# Patient Record
Sex: Male | Born: 1995
Health system: Southern US, Community
[De-identification: ages and names within clinical notes are randomized; demographics above are authoritative.]

## PROBLEM LIST (undated history)

## (undated) DIAGNOSIS — R011 Cardiac murmur, unspecified: Secondary | ICD-10-CM

## (undated) DIAGNOSIS — E162 Hypoglycemia, unspecified: Secondary | ICD-10-CM

## (undated) DIAGNOSIS — R079 Chest pain, unspecified: Secondary | ICD-10-CM

## (undated) DIAGNOSIS — M549 Dorsalgia, unspecified: Secondary | ICD-10-CM

## (undated) DIAGNOSIS — J45909 Unspecified asthma, uncomplicated: Secondary | ICD-10-CM

---
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
## 2013-03-10 ENCOUNTER — Emergency Department (HOSPITAL_COMMUNITY)
Admission: EM | Admit: 2013-03-10 | Discharge: 2013-03-10 | Disposition: A | Discharge status: Home / Self Care | Payer: No Typology Code available for payment source | Attending: Emergency Medicine | Admitting: Emergency Medicine

## 2013-03-10 ENCOUNTER — Emergency Department (HOSPITAL_COMMUNITY): Payer: No Typology Code available for payment source

## 2013-03-10 ENCOUNTER — Encounter (HOSPITAL_COMMUNITY): Payer: Self-pay | Admitting: Emergency Medicine

## 2013-03-10 DIAGNOSIS — Z87828 Personal history of other (healed) physical injury and trauma: Secondary | ICD-10-CM | POA: Insufficient documentation

## 2013-03-10 DIAGNOSIS — M549 Dorsalgia, unspecified: Secondary | ICD-10-CM | POA: Insufficient documentation

## 2013-03-10 DIAGNOSIS — Z8639 Personal history of other endocrine, nutritional and metabolic disease: Secondary | ICD-10-CM | POA: Insufficient documentation

## 2013-03-10 DIAGNOSIS — Z862 Personal history of diseases of the blood and blood-forming organs and certain disorders involving the immune mechanism: Secondary | ICD-10-CM | POA: Insufficient documentation

## 2013-03-10 DIAGNOSIS — J45909 Unspecified asthma, uncomplicated: Secondary | ICD-10-CM | POA: Insufficient documentation

## 2013-03-10 DIAGNOSIS — R079 Chest pain, unspecified: Secondary | ICD-10-CM | POA: Insufficient documentation

## 2013-03-10 DIAGNOSIS — R011 Cardiac murmur, unspecified: Secondary | ICD-10-CM | POA: Insufficient documentation

## 2013-03-10 LAB — CBC
HEMATOCRIT: 42.4 % (ref 39.0–52.0)
Hemoglobin: 14.9 g/dL (ref 13.0–17.0)
MCH: 29.6 pg (ref 26.0–34.0)
MCHC: 35.1 g/dL (ref 30.0–36.0)
MCV: 84.1 fL (ref 78.0–100.0)
Platelets: 207 10*3/uL (ref 150–400)
RBC: 5.04 MIL/uL (ref 4.22–5.81)
RDW: 13.2 % (ref 11.5–15.5)
WBC: 3.4 10*3/uL — AB (ref 4.0–10.5)

## 2013-03-10 LAB — BASIC METABOLIC PANEL
BUN: 11 mg/dL (ref 6–23)
CHLORIDE: 104 meq/L (ref 96–112)
CO2: 25 meq/L (ref 19–32)
Calcium: 9.7 mg/dL (ref 8.4–10.5)
Creatinine, Ser: 0.97 mg/dL (ref 0.50–1.35)
GFR calc Af Amer: >90 mL/min (ref >90)
GFR calc non Af Amer: >90 mL/min (ref >90)
Glucose, Bld: 89 mg/dL (ref 70–99)
Potassium: 4.3 mEq/L (ref 3.7–5.3)
Sodium: 140 mEq/L (ref 137–147)

## 2013-03-10 LAB — I-STAT TROPONIN, ED: Troponin i, poc: 0 ng/mL (ref 0.00–0.08)

## 2013-03-10 LAB — D-DIMER, QUANTITATIVE (NOT AT ARMC): D-Dimer, Quant: <0.27 ug/mL-FEU (ref 0.00–0.48)

## 2013-03-10 MED ORDER — KETOROLAC TROMETHAMINE 60 MG/2ML IM SOLN
60.0000 mg | Freq: Once | INTRAMUSCULAR | Status: AC
  Administered 2013-03-10: 60 mg via INTRAMUSCULAR
  Filled 2013-03-10 (×2): qty 2

## 2013-03-10 MED ORDER — IBUPROFEN 800 MG PO TABS: 800.0000 mg | ORAL_TABLET | Freq: Three times a day (TID) | ORAL | Status: DC
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
## 2013-03-10 NOTE — ED Provider Notes (Signed)
Medical screening examination/treatment/procedure(s) were performed by non-physician practitioner and as supervising physician I was immediately available for consultation/collaboration.   EKG Interpretation   Date/Time:  Monday March 10 2013 13:23:54 EDT Ventricular Rate:  53 PR Interval:  162 QRS Duration: 90 QT Interval:  395 QTC Calculation: 371 R Axis:   87 Text Interpretation:  Sinus or ectopic atrial rhythm Abnrm T, consider  ischemia, anterolateral lds ST elev, probable normal early repol pattern  Anterolateral ST elevation, probable early repolarization No significant  change was found Confirmed by Manus GunningANCOUR  MD, Andria Head [phone]([id number]) on 03/10/2013  1:35:00 PM        Glynn OctaveStephen Timmie Dugue, MD 03/10/13 1555

## 2013-03-10 NOTE — Discharge Instructions (Signed)
Chest Pain (Nonspecific) °It is often hard to give a specific diagnosis for the cause of chest pain. There is always a chance that your pain could be related to something serious, such as a heart attack or a blood clot in the lungs. You need to follow up with your caregiver for further evaluation. °CAUSES  °· Heartburn. °· Pneumonia or bronchitis. °· Anxiety or stress. °· Inflammation around your heart (pericarditis) or lung (pleuritis or pleurisy). °· A blood clot in the lung. °· A collapsed lung (pneumothorax). It can develop suddenly on its own (spontaneous pneumothorax) or from injury (trauma) to the chest. °· Shingles infection (herpes zoster virus). °The chest wall is composed of bones, muscles, and cartilage. Any of these can be the source of the pain. °· The bones can be bruised by injury. °· The muscles or cartilage can be strained by coughing or overwork. °· The cartilage can be affected by inflammation and become sore (costochondritis). °DIAGNOSIS  °Lab tests or other studies, such as X-rays, electrocardiography, stress testing, or cardiac imaging, may be needed to find the cause of your pain.  °TREATMENT  °· Treatment depends on what may be causing your chest pain. Treatment may include: °· Acid blockers for heartburn. °· Anti-inflammatory medicine. °· Pain medicine for inflammatory conditions. °· Antibiotics if an infection is present. °· You may be advised to change lifestyle habits. This includes stopping smoking and avoiding alcohol, caffeine, and chocolate. °· You may be advised to keep your head raised (elevated) when sleeping. This reduces the chance of acid going backward from your stomach into your esophagus. °· Most of the time, nonspecific chest pain will improve within 2 to 3 days with rest and mild pain medicine. °HOME CARE INSTRUCTIONS  °· If antibiotics were prescribed, take your antibiotics as directed. Finish them even if you start to feel better. °· For the next few days, avoid physical  activities that bring on chest pain. Continue physical activities as directed. °· Do not smoke. °· Avoid drinking alcohol. °· Only take over-the-counter or prescription medicine for pain, discomfort, or fever as directed by your caregiver. °· Follow your caregiver's suggestions for further testing if your chest pain does not go away. °· Keep any follow-up appointments you made. If you do not go to an appointment, you could develop lasting (chronic) problems with pain. If there is any problem keeping an appointment, you must call to reschedule. °SEEK MEDICAL CARE IF:  °· You think you are having problems from the medicine you are taking. Read your medicine instructions carefully. °· Your chest pain does not go away, even after treatment. °· You develop a rash with blisters on your chest. °SEEK IMMEDIATE MEDICAL CARE IF:  °· You have increased chest pain or pain that spreads to your arm, neck, jaw, back, or abdomen. °· You develop shortness of breath, an increasing cough, or you are coughing up blood. °· You have severe back or abdominal pain, feel nauseous, or vomit. °· You develop severe weakness, fainting, or chills. °· You have a fever. °THIS IS AN EMERGENCY. Do not wait to see if the pain will go away. Get medical help at once. Call your local emergency services (911 in [place] Do not drive yourself to the hospital. °MAKE SURE YOU:  °· Understand these instructions. °· Will watch your condition. °· Will get help right away if you are not doing well or get worse. °Document Released: 09/28/2004 Document Revised: 03/13/2011 Document Reviewed: 07/25/2007 °[employer]® Patient Information ©2014 [employer]   [employer]. ° °

## 2013-03-10 NOTE — ED Notes (Signed)
Patient taken to Xray  

## 2013-03-10 NOTE — ED Notes (Signed)
PA Browning at the bedside.
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
## 2013-03-10 NOTE — ED Notes (Signed)
Patient requested different medication for pain. PA notified. Patient notified that the PA prefers to see the patient first. Verbalized understanding.

## 2013-03-21 ENCOUNTER — Emergency Department (HOSPITAL_COMMUNITY)
Admission: EM | Admit: 2013-03-21 | Discharge: 2013-03-21 | Disposition: A | Discharge status: Home / Self Care | Payer: 59 | Attending: Emergency Medicine | Admitting: Emergency Medicine

## 2013-03-21 ENCOUNTER — Encounter (HOSPITAL_COMMUNITY): Payer: Self-pay | Admitting: Emergency Medicine

## 2013-03-21 DIAGNOSIS — R42 Dizziness and giddiness: Secondary | ICD-10-CM | POA: Insufficient documentation

## 2013-03-21 DIAGNOSIS — R079 Chest pain, unspecified: Secondary | ICD-10-CM | POA: Insufficient documentation

## 2013-03-21 DIAGNOSIS — Z862 Personal history of diseases of the blood and blood-forming organs and certain disorders involving the immune mechanism: Secondary | ICD-10-CM | POA: Insufficient documentation

## 2013-03-21 DIAGNOSIS — R011 Cardiac murmur, unspecified: Secondary | ICD-10-CM | POA: Insufficient documentation

## 2013-03-21 DIAGNOSIS — M549 Dorsalgia, unspecified: Secondary | ICD-10-CM | POA: Insufficient documentation

## 2013-03-21 DIAGNOSIS — Z87448 Personal history of other diseases of urinary system: Secondary | ICD-10-CM | POA: Insufficient documentation

## 2013-03-21 DIAGNOSIS — J45909 Unspecified asthma, uncomplicated: Secondary | ICD-10-CM | POA: Insufficient documentation

## 2013-03-21 DIAGNOSIS — Z8639 Personal history of other endocrine, nutritional and metabolic disease: Secondary | ICD-10-CM | POA: Insufficient documentation

## 2013-03-21 LAB — CBC WITH DIFFERENTIAL/PLATELET
BASOS ABS: 0 10*3/uL (ref 0.0–0.1)
Basophils Relative: 1 % (ref 0–1)
Eosinophils Absolute: 0.1 10*3/uL (ref 0.0–0.7)
Eosinophils Relative: 2 % (ref 0–5)
HEMATOCRIT: 44.4 % (ref 39.0–52.0)
Hemoglobin: 15.4 g/dL (ref 13.0–17.0)
LYMPHS PCT: 45 % (ref 12–46)
Lymphs Abs: 1.4 10*3/uL (ref 0.7–4.0)
MCH: 29.2 pg (ref 26.0–34.0)
MCHC: 34.7 g/dL (ref 30.0–36.0)
MCV: 84.3 fL (ref 78.0–100.0)
MONO ABS: 0.2 10*3/uL (ref 0.1–1.0)
Monocytes Relative: 7 % (ref 3–12)
NEUTROS ABS: 1.5 10*3/uL — AB (ref 1.7–7.7)
Neutrophils Relative %: 45 % (ref 43–77)
PLATELETS: 204 10*3/uL (ref 150–400)
RBC: 5.27 MIL/uL (ref 4.22–5.81)
RDW: 13.5 % (ref 11.5–15.5)
WBC: 3.2 10*3/uL — AB (ref 4.0–10.5)

## 2013-03-21 LAB — BASIC METABOLIC PANEL
BUN: 17 mg/dL (ref 6–23)
CHLORIDE: 100 meq/L (ref 96–112)
CO2: 25 meq/L (ref 19–32)
Calcium: 10 mg/dL (ref 8.4–10.5)
Creatinine, Ser: 1.17 mg/dL (ref 0.50–1.35)
GFR calc Af Amer: >90 mL/min (ref >90)
GFR calc non Af Amer: 89 mL/min — ABNORMAL LOW (ref >90)
Glucose, Bld: 87 mg/dL (ref 70–99)
Potassium: 4.1 mEq/L (ref 3.7–5.3)
SODIUM: 138 meq/L (ref 137–147)

## 2013-03-21 LAB — I-STAT TROPONIN, ED: TROPONIN I, POC: 0 ng/mL (ref 0.00–0.08)

## 2013-03-21 LAB — D-DIMER, QUANTITATIVE: D-Dimer, Quant: <0.27 ug/mL-FEU (ref 0.00–0.48)

## 2013-03-21 MED ORDER — IBUPROFEN 800 MG PO TABS: 800.0000 mg | ORAL_TABLET | Freq: Three times a day (TID) | ORAL | Status: DC

## 2013-03-21 MED ORDER — HYDROCODONE-ACETAMINOPHEN 5-325 MG PO TABS
1.0000 | ORAL_TABLET | Freq: Once | ORAL | Status: AC
  Administered 2013-03-21: 1 via ORAL
  Filled 2013-03-21: qty 1

## 2013-03-21 NOTE — ED Notes (Addendum)
Pt c/o central and L side chest pain radiating into back x "over three months."  Pain score 9/10.  Pt reports "some" dizziness.  [facility] for same and diagnosed w/ nonspecific chest pain.

## 2013-03-21 NOTE — ED Provider Notes (Signed)
CSN: [id number]     Arrival date & time 03/21/13  1050 History   First MD Initiated Contact with Patient 03/21/13 1131     Chief Complaint  Patient presents with  . Chest Pain  . Back Pain    HPI Pt has history of gynecomastia.  He was having pain in his chest associated with that.    Pt had surgery one year ago approximately and symptoms were fine for several months.  He started having pain again several months later.  The pain is sharp and aching on the left side.  It occurs every day.   Working too hard makes it hurt more.  Today he felt dizzy.   No fevers, no cough, no shortness of breath.  No tobacco use.  He does not have a primary care doctor.  He was evaluated in the ED previously. Past Medical History  Diagnosis Date  . Hypoglycemia   . Asthma     seasonal- no current treatment  . 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 Heart murmur   . Back pain last 1-2 years    lower back-evaluation by PCP and physical therapy @ [facility]  . Chest pain march 2014    pain and lump at rt breast area   Past Surgical History  Procedure Laterality Date  . Gynecomastia mastectomy Right 04/12/2012    Procedure: MASTECTOMY GYNECOMASTIA;  Surgeon: Liz MaladyBurke E Thompson, MD;  Location: [facility];  Service: General;  Laterality: Right;  Excision of right gynecomastia   History reviewed. No pertinent family history. History  Substance Use Topics  . Smoking status: Never Smoker   . Smokeless tobacco: Never Used  . Alcohol Use: No    Review of Systems    Allergies  Review of patient's allergies indicates no known allergies.  Home Medications   Current Outpatient Rx  Name  Route  Sig  Dispense  Refill  . ibuprofen (ADVIL,MOTRIN) 800 MG tablet   Oral   Take 1 tablet (800 mg total) by mouth 3 (three) times daily.   21 tablet   0    BP 126/88  Pulse 60  Temp(Src) 97.9 F (36.6 C) (Oral)  Resp 16  SpO2 100% Physical Exam  Nursing note and  vitals reviewed. Constitutional: He appears well-developed and well-nourished. No distress.  HENT:  Head: Normocephalic and atraumatic.  Right Ear: External ear normal.  Left Ear: External ear normal.  Eyes: Conjunctivae are normal. Right eye exhibits no discharge. Left eye exhibits no discharge. No scleral icterus.  Neck: Neck supple. No tracheal deviation present.  Cardiovascular: Normal rate, regular rhythm and intact distal pulses.   Pulmonary/Chest: Effort normal and breath sounds normal. No stridor. No respiratory distress. He has no wheezes. He has no rales.  Abdominal: Soft. Bowel sounds are normal. He exhibits no distension. There is no tenderness. There is no rebound and no guarding.  Musculoskeletal: He exhibits no edema and no tenderness.  Neurological: He is alert. He has normal strength. No cranial nerve deficit (no facial droop, extraocular movements intact, no slurred speech) or sensory deficit. He exhibits normal muscle tone. He displays no seizure activity. Coordination normal.  Skin: Skin is warm and dry. No rash noted.  Psychiatric: He has a normal mood and affect.    ED Course  Procedures (including critical care time) Labs Review Labs Reviewed  CBC WITH DIFFERENTIAL - Abnormal; Notable for the following:    WBC 3.2 (*)  Neutro Abs 1.5 (*)    All other components within normal limits  BASIC METABOLIC PANEL - Abnormal; Notable for the following:    GFR calc non Af Amer 89 (*)    All other components within normal limits  D-DIMER, QUANTITATIVE  I-STAT TROPOININ, ED   CXR performed this month.  No acute findings noted.  EKG Interpretation   Date/Time:  Friday March 21 2013 12:03:48 EDT Ventricular Rate:  60 PR Interval:  159 QRS Duration: 88 QT Interval:  397 QTC Calculation: 397 R Axis:   82 Text Interpretation:  Sinus rhythm Left ventricular hypertrophy ST elev,  probable normal early repol pattern No significant change since last  tracing Confirmed  by Jamayah Myszka  MD-J, Evadene Wardrip ([id number]) on 03/21/2013 12:14:12 PM      MDM   Final diagnoses:  Chest pain    At this time there does not appear to be any evidence of an acute emergency medical condition and the patient appears stable for discharge with appropriate outpatient follow up.  Discussed findings with patient. Reassured him today.  Recommend follow up with  A PCP for further evaluation.    Celene Kras, MD 03/21/13 [phone]

## 2013-03-21 NOTE — Discharge Instructions (Signed)
Chest Pain (Nonspecific) °It is often hard to give a specific diagnosis for the cause of chest pain. There is always a chance that your pain could be related to something serious, such as a heart attack or a blood clot in the lungs. You need to follow up with your caregiver for further evaluation. °CAUSES  °· Heartburn. °· Pneumonia or bronchitis. °· Anxiety or stress. °· Inflammation around your heart (pericarditis) or lung (pleuritis or pleurisy). °· A blood clot in the lung. °· A collapsed lung (pneumothorax). It can develop suddenly on its own (spontaneous pneumothorax) or from injury (trauma) to the chest. °· Shingles infection (herpes zoster virus). °The chest wall is composed of bones, muscles, and cartilage. Any of these can be the source of the pain. °· The bones can be bruised by injury. °· The muscles or cartilage can be strained by coughing or overwork. °· The cartilage can be affected by inflammation and become sore (costochondritis). °DIAGNOSIS  °Lab tests or other studies, such as X-rays, electrocardiography, stress testing, or cardiac imaging, may be needed to find the cause of your pain.  °TREATMENT  °· Treatment depends on what may be causing your chest pain. Treatment may include: °· Acid blockers for heartburn. °· Anti-inflammatory medicine. °· Pain medicine for inflammatory conditions. °· Antibiotics if an infection is present. °· You may be advised to change lifestyle habits. This includes stopping smoking and avoiding alcohol, caffeine, and chocolate. °· You may be advised to keep your head raised (elevated) when sleeping. This reduces the chance of acid going backward from your stomach into your esophagus. °· Most of the time, nonspecific chest pain will improve within 2 to 3 days with rest and mild pain medicine. °HOME CARE INSTRUCTIONS  °· If antibiotics were prescribed, take your antibiotics as directed. Finish them even if you start to feel better. °· For the next few days, avoid physical  activities that bring on chest pain. Continue physical activities as directed. °· Do not smoke. °· Avoid drinking alcohol. °· Only take over-the-counter or prescription medicine for pain, discomfort, or fever as directed by your caregiver. °· Follow your caregiver's suggestions for further testing if your chest pain does not go away. °· Keep any follow-up appointments you made. If you do not go to an appointment, you could develop lasting (chronic) problems with pain. If there is any problem keeping an appointment, you must call to reschedule. °SEEK MEDICAL CARE IF:  °· You think you are having problems from the medicine you are taking. Read your medicine instructions carefully. °· Your chest pain does not go away, even after treatment. °· You develop a rash with blisters on your chest. °SEEK IMMEDIATE MEDICAL CARE IF:  °· You have increased chest pain or pain that spreads to your arm, neck, jaw, back, or abdomen. °· You develop shortness of breath, an increasing cough, or you are coughing up blood. °· You have severe back or abdominal pain, feel nauseous, or vomit. °· You develop severe weakness, fainting, or chills. °· You have a fever. °THIS IS AN EMERGENCY. Do not wait to see if the pain will go away. Get medical help at once. Call your local emergency services (911 in [place] Do not drive yourself to the hospital. °MAKE SURE YOU:  °· Understand these instructions. °· Will watch your condition. °· Will get help right away if you are not doing well or get worse. °Document Released: 09/28/2004 Document Revised: 03/13/2011 Document Reviewed: 07/25/2007 °ExitCare® Patient Information ©2014 [employer],   LLC. ° ° ° °Emergency Department Resource Guide °1) Find a Doctor and Pay Out of Pocket °Although you won't have to find out who is covered by your insurance plan, it is a good idea to ask around and get recommendations. You will then need to call the office and see if the doctor you have chosen will accept you as a new  patient and what types of options they offer for patients who are self-pay. Some doctors offer discounts or will set up payment plans for their patients who do not have insurance, but you will need to ask so you aren't surprised when you get to your appointment. ° °2) Contact Your Local Health Department °Not all health departments have doctors that can see patients for sick visits, but many do, so it is worth a call to see if yours does. If you don't know where your local health department is, you can check in your phone book. The CDC also has a tool to help you locate your state's health department, and many state websites also have listings of all of their local health departments. ° °3) Find a Walk-in Clinic °If your illness is not likely to be very severe or complicated, you may want to try a walk in clinic. These are popping up all over the country in pharmacies, drugstores, and shopping centers. They're usually staffed by nurse practitioners or physician assistants that have been trained to treat common illnesses and complaints. They're usually fairly quick and inexpensive. However, if you have serious medical issues or chronic medical problems, these are probably not your best option. ° °No Primary Care Doctor: °- Call [facility] at  832-8000 - they can help you locate a primary care doctor that  accepts your insurance, provides certain services, etc. °- Physician Referral Service- 1-800-533-3463 ° °Chronic Pain Problems: °Organization         Address  Phone   Notes  °[facility]  (336) 297-2271 Patients need to be referred by their primary care doctor.  ° °Medication Assistance: °Organization         Address  Phone   Notes  °[place] [facility] [address] °[place], [place] [address] (336) 641-8030 --Must be a resident of [place] °-- Must have NO insurance coverage whatsoever (no Medicaid/ Medicare, etc.) °-- The pt. MUST have a primary  care doctor that directs their care regularly and follows them in the community °  °[facility]  (866) 331-1348   °[employer]  (888) 892-1162   ° °Agencies that provide inexpensive medical care: °Organization         Address  Phone   Notes  °[facility]  (336) 832-8035   °[facility]    (336) 832-7272   °[facility] [address] °[place], [place] [address] (336) 832-4777   °[facility] [address], °[place] (336) 271-4999   °[facility]    (336) 373-0678   °[facility]    (336) 272-1050   °[facility] ° [address], [place] Phone:  (336) 832-4444, Fax:  (336) 832-4440 Hours of Operation:  9 am - 6 pm, M-F.  Also accepts Medicaid/Medicare and self-pay.  °[facility] ° [address], [place] Phone: (336) 832-3150, Fax: (336) 832-3151. Hours of Operation:  8:30 am - 5:30 pm, M-F.  Also accepts Medicaid and self-pay.  °  [facility] [place] [address], [place] Phone: (336) 878-6027   °[facility] [address], [place], [place] (336)723-1848, Ext. 123 Mondays & Thursdays: 7-9 AM.  First 15 patients are seen on a first come, first serve basis. °  ° °Medicaid-accepting [place] Providers: ° °Organization         Address  Phone   Notes  °[facility] [address], [place] (336) 641-2100 Also accepts self-pay patients.  °[facility] [address], [place] ° (336) 856-9996   °[facility] [address], [place] (336) 288-8857   °[facility] [address] [place] [address], [place] (336) 299-7000   °Veita Bland [address], [place]  ° (336) 373-1557 Only accepts [place] Access Medicaid patients after they have their name applied to their card.  ° °Self-Pay (no insurance) in [place]: ° °Organization         Address  Phone   Notes  °Sickle Cell Patients, [facility] [address], [place] (336) 832-1970   °[facility] [address], [place] (336) 832-4400   °[facility] ° [address], [place] (336) 992-4800   °[facility]/Dr. Osei-Bonsu ° [address] [place] [address], [place] or [address], [place] (336) 841-8500 Phone number for both [place] and [place] locations is the same.  °[facility] [address], [place] (336) 299-0000   °[facility] [address] [place] [address], [place] or [address] (336) 852-7530 °(336) 878-2260   °[facility] [address], [place] (336) 350-1642, phone; (336) 294-5005, fax Sees patients 1st and 3rd Saturday of every month.  Must not qualify for public or private insurance (i.e. Medicaid, Medicare, [place] Health Choice, Veterans' Benefits) • Household income should be no more than 200% of the poverty level •The clinic cannot treat you if you are pregnant or think you are pregnant • Sexually transmitted diseases are not treated at the clinic.  ° ° °Dental Care: °Organization         Address  Phone  Notes  °[facility] [facility] [address], [place] (336) 641-6152 Accepts children up to age 21 who are enrolled in Medicaid or [place] Health Choice; pregnant women with a Medicaid card; and children who have applied for Medicaid or [place] Health Choice, but were declined, whose parents can pay a reduced fee at time of service.  °[facility] [place]  [address], [place] (336) 641-7733 Accepts children up to age 21 who are enrolled in Medicaid or [place] Health Choice; pregnant women with a Medicaid card; and children who have applied for Medicaid or [place] Health Choice, but were declined, whose parents can  pay a reduced fee at time of service.  °[facility] ° [address], [place] (336) 641-4533 Patients are seen by appointment only. Walk-ins are not accepted. [facility] will see patients 18 years of age and older. °Monday - Tuesday (8am-5pm) °Most Wednesdays (8:30-5pm) °$30 per visit, cash only  °[facility] ° [address], [place] (336) 641-4533 Patients are seen by appointment only. Walk-ins are not accepted. [facility] will see patients 18 years of   age and older. °One Wednesday Evening (Monthly: Volunteer Based).  $30 per visit, cash only  °[facility]  (919) 537-3737 for adults; Children under age 4, call Graduate Pediatric Dentistry at (919) [phone]. Children aged 4-14, please call (919) [phone] to request a pediatric application. ° Dental services are provided in all areas of dental care including fillings, crowns and bridges, complete and partial dentures, implants, gum treatment, root canals, and extractions. Preventive care is also provided. Treatment is provided to both adults and children. °Patients are selected via a lottery and there is often a waiting list. °  °[facility] [address], °[place] ° (336) 763-8833 [email or web address] °  °[facility] [address], [place], [place] (336)723-1848, Ext. 123 Second and Fourth Thursday of each month, opens at 6:30 AM; Clinic ends at 9 AM.  Patients are seen on a first-come first-served basis, and a limited number are seen during each clinic.  ° °[facility] ° [address], [place], [place] (336) 723-7904   Eligibility Requirements °You must have lived in [place], or [place] counties for at least the last three months. °  You cannot be eligible for state or federal sponsored healthcare insurance, including Veterans Administration, Medicaid, or Medicare. °  You generally cannot be eligible for healthcare  insurance through your employer.  °  How to apply: °Eligibility screenings are held every Tuesday and Wednesday afternoon from 1:00 pm until 4:00 pm. You do not need an appointment for the interview!  °[facility] [address], [place], [place] 336-631-2330   °[facility]  336-342-8273   °[facility]  336-703-3100   °[facility]  336-570-6415   ° °Behavioral Health Resources in the Community: °Intensive Outpatient Programs °Organization         Address  Phone  Notes  °[facility] [address], [place], [place] 336-878-6098   °[facility] [address], [place], [place] 336-832-9800   °[facility] [address], [place], [place] ° 336-882-2125   °[facility] [address],  °[place], [place] 1-800-853-5163 or 336-641-4981   °Substance Abuse Resources °Organization         Address  Phone  Notes  °[facility]  336-882-2125   °[facility]  336-784-9470   °[facility]  336-285-9073   °[facility]  336-845-3988   °Residential & Outpatient Substance Abuse Program  1-800-659-3381   °Psychological Services °Organization         Address  Phone  Notes  °[facility]  336- 832-9600   °[facility]  336- 378-7881   °[facility] [address], [place] 1-800-853-5163 or 336-641-4981   ° °Mobile Crisis Teams °Organization         Address  Phone  Notes  °[facility], Mobile Crisis Care Unit  1-877-626-1772   °Assertive °[facility] ° [address] [place], [place] 336-834-9664   °Sharon DeEsch [address] °[place] 336-554-5454   ° °Self-Help/Support Groups °Organization         Address  Phone             Notes  °[facility]  - variety of support groups  336- 373-1402 Call for more information  °Narcotics Anonymous (NA),  [facility] [address], °[place]  2   meetings at this location  ° °Residential Treatment Programs °Organization         Address  Phone  Notes  °[facility] [address],    °[place]  1-866-801-8205   °[facility] ° [address], [place], [place] 704-293-8524   °[facility] [address], [place] 336-845-3988 Admissions: 8am-3pm M-F  °[facility] [address]    °[place], [place] 336-841-1104   °[facility] [address], [place], [place] 336-379-7146   °[facility] [address]  °[place], [place] 336-285-9073   °[facility] Intensive Outpatient [address], [place], [place] 336-852-3033   °[facility] [address]  °[place], [place] 1-877-615-2722 or 336-784-9470   °[facility] [address] [place], [place] 336-227-7417 Accepts Medicaid  °[facility] [address]  °[place] 1-800-659-3381 Substance Abuse/Addiction Treatment  ° °[place] Behavioral Health Resources °Organization         Address  Phone  Notes  °[facility]  (888) 581-9988   °Julie Brannon, PhD [address] [place], [place]   (336) 349-5553 or (336) 951-0000   °[facility]   [address] °[place], [place] (336) 349-4454   °[facility] [address], [place], [place] (336) 342-8316 Insurance/Medicaid/sponsorship through [facility]  °[facility] [address]                                    [place], [place] (336) 342-8316 Therapy/tele-psych/case  °[facility] [address]  ° [place], [place] (336) 349-2233    °Dr. Arfeen  (336) 349-4544   °[facility] [place]  [employer] [place] [facility] 1) [address], [place] °2) [address], [place] °3)  [address], [place] (336) 349-3220 °(336) 342-7768 ° °(336) 342-8140    °[place] [facility] (336) 342-1394 or (336) 342-3537 (After Hours)    ° ° ° °

## 2013-03-21 NOTE — ED Notes (Addendum)
Patient with back pain for three months, started with chest pain three weeks ago, then suddenly became worse this AM. Patient has history of asthma, but currently is not taking any asthma meds.  Became SOB this AM also.

## 2014-02-07 ENCOUNTER — Ambulatory Visit (HOSPITAL_COMMUNITY): Payer: Self-pay | Attending: Family Medicine

## 2014-02-07 ENCOUNTER — Emergency Department (INDEPENDENT_AMBULATORY_CARE_PROVIDER_SITE_OTHER)
Admission: EM | Admit: 2014-02-07 | Discharge: 2014-02-07 | Disposition: A | Discharge status: Home / Self Care | Payer: Self-pay | Source: Home / Self Care | Attending: Family Medicine | Admitting: Family Medicine

## 2014-02-07 ENCOUNTER — Encounter (HOSPITAL_COMMUNITY): Payer: Self-pay | Admitting: *Deleted

## 2014-02-07 DIAGNOSIS — S60221A Contusion of right hand, initial encounter: Secondary | ICD-10-CM

## 2014-02-07 DIAGNOSIS — S61210A Laceration without foreign body of right index finger without damage to nail, initial encounter: Secondary | ICD-10-CM | POA: Insufficient documentation

## 2014-02-07 DIAGNOSIS — T148XXA Other injury of unspecified body region, initial encounter: Secondary | ICD-10-CM

## 2014-02-07 DIAGNOSIS — T148 Other injury of unspecified body region: Secondary | ICD-10-CM

## 2014-02-07 MED ORDER — DICLOFENAC SODIUM 75 MG PO TBEC: 75.0000 mg | DELAYED_RELEASE_TABLET | Freq: Two times a day (BID) | ORAL | Status: AC
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
## 2014-02-07 NOTE — ED Notes (Signed)
Pt to XR dept.
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
## 2014-02-07 NOTE — ED Notes (Signed)
Pt returned from XR dept.  Awaiting results.
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
## 2014-12-27 ENCOUNTER — Telehealth: Payer: Self-pay | Admitting: Nurse Practitioner

## 2014-12-27 DIAGNOSIS — H5789 Other specified disorders of eye and adnexa: Secondary | ICD-10-CM

## 2014-12-27 DIAGNOSIS — H578 Other specified disorders of eye and adnexa: Secondary | ICD-10-CM

## 2014-12-27 MED ORDER — POLYMYXIN B-TRIMETHOPRIM 10000-0.1 UNIT/ML-% OP SOLN: 2.0000 [drp] | OPHTHALMIC | Status: AC

## 2014-12-27 NOTE — Progress Notes (Signed)

## 2016-02-15 ENCOUNTER — Emergency Department (HOSPITAL_COMMUNITY)
Admission: EM | Admit: 2016-02-15 | Discharge: 2016-02-15 | Disposition: A | Discharge status: Home / Self Care | Payer: 59 | Attending: Emergency Medicine | Admitting: Emergency Medicine

## 2016-02-15 ENCOUNTER — Other Ambulatory Visit: Payer: Self-pay

## 2016-02-15 ENCOUNTER — Emergency Department (HOSPITAL_COMMUNITY): Payer: 59

## 2016-02-15 ENCOUNTER — Encounter (HOSPITAL_COMMUNITY): Payer: Self-pay

## 2016-02-15 DIAGNOSIS — R0789 Other chest pain: Secondary | ICD-10-CM | POA: Diagnosis not present

## 2016-02-15 DIAGNOSIS — J45909 Unspecified asthma, uncomplicated: Secondary | ICD-10-CM | POA: Insufficient documentation

## 2016-02-15 DIAGNOSIS — Z79899 Other long term (current) drug therapy: Secondary | ICD-10-CM | POA: Insufficient documentation

## 2016-02-15 DIAGNOSIS — R079 Chest pain, unspecified: Secondary | ICD-10-CM | POA: Insufficient documentation

## 2016-02-15 LAB — CBC WITH DIFFERENTIAL/PLATELET
BASOS ABS: 0 10*3/uL (ref 0.0–0.1)
Basophils Relative: 1 %
EOS ABS: 0.1 10*3/uL (ref 0.0–0.7)
Eosinophils Relative: 2 %
HCT: 43.6 % (ref 39.0–52.0)
HEMOGLOBIN: 15.1 g/dL (ref 13.0–17.0)
Lymphocytes Relative: 46 %
Lymphs Abs: 1.8 10*3/uL (ref 0.7–4.0)
MCH: 29.4 pg (ref 26.0–34.0)
MCHC: 34.6 g/dL (ref 30.0–36.0)
MCV: 84.8 fL (ref 78.0–100.0)
Monocytes Absolute: 0.3 10*3/uL (ref 0.1–1.0)
Monocytes Relative: 7 %
NEUTROS PCT: 44 %
Neutro Abs: 1.7 10*3/uL (ref 1.7–7.7)
PLATELETS: 204 10*3/uL (ref 150–400)
RBC: 5.14 MIL/uL (ref 4.22–5.81)
RDW: 13.8 % (ref 11.5–15.5)
WBC: 3.9 10*3/uL — ABNORMAL LOW (ref 4.0–10.5)

## 2016-02-15 LAB — BASIC METABOLIC PANEL
Anion gap: 9 (ref 5–15)
BUN: 8 mg/dL (ref 6–20)
CO2: 25 mmol/L (ref 22–32)
Calcium: 9.8 mg/dL (ref 8.9–10.3)
Chloride: 108 mmol/L (ref 101–111)
Creatinine, Ser: 1.03 mg/dL (ref 0.61–1.24)
Glucose, Bld: 92 mg/dL (ref 65–99)
POTASSIUM: 3.9 mmol/L (ref 3.5–5.1)
SODIUM: 142 mmol/L (ref 135–145)

## 2016-02-15 MED ORDER — KETOROLAC TROMETHAMINE 30 MG/ML IJ SOLN
30.0000 mg | Freq: Once | INTRAMUSCULAR | Status: AC
  Administered 2016-02-15: 30 mg via INTRAVENOUS
  Filled 2016-02-15: qty 1

## 2016-02-15 MED ORDER — IBUPROFEN 800 MG PO TABS: 800.0000 mg | ORAL_TABLET | Freq: Three times a day (TID) | ORAL | 0 refills | Status: AC

## 2016-02-15 NOTE — ED Triage Notes (Signed)
Patient reports that he was awakened with chest heaviness and reports a deep pain. Denies trauma. Denies cold, cough. No wheezing. Mild pain on arrival with no associated symptoms

## 2016-02-15 NOTE — ED Provider Notes (Signed)
[facility]-EMERGENCY DEPT Provider Note   CSN: [id number] Arrival date & time: 02/15/16  [id number]     History   Chief Complaint Chief Complaint  Patient presents with  . Chest Pain    HPI Larry Keith is a 21 y.o. male.  Patient with past medical history remarkable for asthma and congenital heart murmur presents emergency department with chief complaint of left-sided chest pain. He states the symptoms awaken him from sleep this morning. He states that the pain is moderate and comes intermittently. It does not radiate. He denies any associated shortness of breath, wheezing, or cough. Denies any fevers chills. Denies any burning in his throat, abdominal pain, nausea, or vomiting. He has not taken anything for his symptoms. He denies any crack cocaine use. He states that he has had symptoms similar to this intermittently for months. There are no other associated symptoms.   The history is provided by the patient. No language interpreter was used.    Past Medical History:  Diagnosis Date  . Asthma    seasonal- no current treatment  . Back pain last 1-2 years   lower back-evaluation by PCP and physical therapy @ [facility]  . Chest pain march 2014   pain and lump at rt breast area  . Heart murmur   . Hypoglycemia   . 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 Gynecomastia, male 04/03/2012    Past Surgical History:  Procedure Laterality Date  . GYNECOMASTIA MASTECTOMY Right 04/12/2012   Procedure: MASTECTOMY GYNECOMASTIA;  Surgeon: Liz MaladyBurke E Thompson, MD;  Location: [facility];  Service: General;  Laterality: Right;  Excision of right gynecomastia       Home Medications    Prior to Admission medications   Medication Sig Start Date End Date Taking? Authorizing Provider  diclofenac (VOLTAREN) 75 MG EC tablet Take 1 tablet (75 mg total) by mouth 2 (two) times daily. 02/07/14   Ozella Rocksavid J  Merrell, MD  ibuprofen (ADVIL,MOTRIN) 800 MG tablet Take 1 tablet (800 mg total) by mouth 3 (three) times daily. 03/21/13   Linwood DibblesJon Knapp, MD  trimethoprim-polymyxin b (POLYTRIM) ophthalmic solution Place 2 drops into both eyes every 4 (four) hours. 12/27/14   Mary-Margaret Daphine DeutscherMartin, FNP    Family History Family History  Problem Relation Age of Onset  . Arthritis Mother     Social History Social History  Substance Use Topics  . Smoking status: Never Smoker  . Smokeless tobacco: Never Used  . Alcohol use No     Allergies   Hydrocodone   Review of Systems Review of Systems  Cardiovascular: Positive for chest pain.  All other systems reviewed and are negative.    Physical Exam Updated Vital Signs BP 129/87   Pulse (!) 58   Temp 97.8 F (36.6 C) (Oral)   Resp 12   Ht 6\' 1"  (1.854 m)   Wt 79.4 kg   SpO2 99%   BMI 23.09 kg/m   Physical Exam  Constitutional: He is oriented to person, place, and time. He appears well-developed and well-nourished.  HENT:  Head: Normocephalic and atraumatic.  Eyes: Conjunctivae and EOM are normal. Pupils are equal, round, and reactive to light. Right eye exhibits no discharge. Left eye exhibits no discharge. No scleral icterus.  Neck: Normal range of motion. Neck supple. No JVD present.  Cardiovascular: Normal rate, regular rhythm and normal heart sounds.  Exam  reveals no gallop and no friction rub.   No murmur heard. Pulmonary/Chest: Effort normal and breath sounds normal. No respiratory distress. He has no wheezes. He has no rales. He exhibits no tenderness.  Clear to auscultation bilaterally No anterior chest wall tenderness  Abdominal: Soft. He exhibits no distension and no mass. There is no tenderness. There is no rebound and no guarding.  No focal abdominal tenderness, no RLQ tenderness or pain at McBurney's point, no RUQ tenderness or Murphy's sign, no left-sided abdominal tenderness, no fluid wave, or signs of peritonitis     Musculoskeletal: Normal range of motion. He exhibits no edema or tenderness.  Neurological: He is alert and oriented to person, place, and time.  Skin: Skin is warm and dry.  Psychiatric: He has a normal mood and affect. His behavior is normal. Judgment and thought content normal.  Nursing note and vitals reviewed.    ED Treatments / Results  Labs (all labs ordered are listed, but only abnormal results are displayed) Labs Reviewed  CBC WITH DIFFERENTIAL/PLATELET  BASIC METABOLIC PANEL    EKG  EKG Interpretation None       Radiology No results found.  Procedures Procedures (including critical care time)  Medications Ordered in ED Medications  ketorolac (TORADOL) 30 MG/ML injection 30 mg (not administered)     Initial Impression / Assessment and Plan / ED Course  I have reviewed the triage vital signs and the nursing notes.  Pertinent labs & imaging results that were available during my care of the patient were reviewed by me and considered in my medical decision making (see chart for details).     Patient has had intermittent chest pain times months. Symptoms began again this morning and awakened him from sleep. He denies any associated fever, shortness of breath, or cough. Will check chest x-ray, labs, and EKG. Treat pain with Toradol.  Have a low suspicion of ACS given the patient's age and other risk factors. I doubt PE, PERC negative.  Chest x-ray and labs are reassuring.    Final Clinical Impressions(s) / ED Diagnoses   Final diagnoses:  Nonspecific chest pain    New Prescriptions Discharge Medication List as of 02/15/2016 10:09 AM       Roxy Horseman, PA-C 02/15/16 1501    Azalia Bilis, MD 02/16/16 [phone]

## 2016-02-15 NOTE — ED Notes (Signed)
Patient transported to X-ray 

## 2016-03-03 ENCOUNTER — Emergency Department (HOSPITAL_COMMUNITY): Payer: 59

## 2016-03-03 ENCOUNTER — Encounter (HOSPITAL_COMMUNITY): Payer: Self-pay | Admitting: Emergency Medicine

## 2016-03-03 ENCOUNTER — Emergency Department (HOSPITAL_COMMUNITY)
Admission: EM | Admit: 2016-03-03 | Discharge: 2016-03-03 | Disposition: A | Discharge status: Home / Self Care | Payer: 59 | Attending: Emergency Medicine | Admitting: Emergency Medicine

## 2016-03-03 DIAGNOSIS — R0602 Shortness of breath: Secondary | ICD-10-CM | POA: Diagnosis not present

## 2016-03-03 DIAGNOSIS — J45909 Unspecified asthma, uncomplicated: Secondary | ICD-10-CM | POA: Insufficient documentation

## 2016-03-03 DIAGNOSIS — M940 Chondrocostal junction syndrome [Tietze]: Secondary | ICD-10-CM | POA: Diagnosis not present

## 2016-03-03 DIAGNOSIS — R079 Chest pain, unspecified: Secondary | ICD-10-CM | POA: Diagnosis not present

## 2016-03-03 DIAGNOSIS — R0781 Pleurodynia: Secondary | ICD-10-CM | POA: Diagnosis not present

## 2016-03-03 LAB — BASIC METABOLIC PANEL
Anion gap: 10 (ref 5–15)
BUN: 12 mg/dL (ref 6–20)
CHLORIDE: 102 mmol/L (ref 101–111)
CO2: 27 mmol/L (ref 22–32)
CREATININE: 1.12 mg/dL (ref 0.61–1.24)
Calcium: 10.1 mg/dL (ref 8.9–10.3)
GFR calc Af Amer: >60 mL/min (ref >60)
GLUCOSE: 78 mg/dL (ref 65–99)
POTASSIUM: 4 mmol/L (ref 3.5–5.1)
SODIUM: 139 mmol/L (ref 135–145)

## 2016-03-03 LAB — CBC
HCT: 45.3 % (ref 39.0–52.0)
Hemoglobin: 15.3 g/dL (ref 13.0–17.0)
MCH: 28.9 pg (ref 26.0–34.0)
MCHC: 33.8 g/dL (ref 30.0–36.0)
MCV: 85.6 fL (ref 78.0–100.0)
Platelets: 198 10*3/uL (ref 150–400)
RBC: 5.29 MIL/uL (ref 4.22–5.81)
RDW: 13.8 % (ref 11.5–15.5)
WBC: 5.1 10*3/uL (ref 4.0–10.5)

## 2016-03-03 LAB — I-STAT TROPONIN, ED
Troponin i, poc: 0 ng/mL (ref 0.00–0.08)
Troponin i, poc: 0 ng/mL (ref 0.00–0.08)

## 2016-03-03 LAB — D-DIMER, QUANTITATIVE: D-Dimer, Quant: <0.27 ug/mL-FEU (ref 0.00–0.50)

## 2016-03-03 MED ORDER — NAPROXEN 500 MG PO TABS: 500.0000 mg | ORAL_TABLET | Freq: Two times a day (BID) | ORAL | 0 refills | Status: AC

## 2016-03-03 MED ORDER — KETOROLAC TROMETHAMINE 60 MG/2ML IM SOLN
60.0000 mg | Freq: Once | INTRAMUSCULAR | Status: AC
  Administered 2016-03-03: 60 mg via INTRAMUSCULAR
  Filled 2016-03-03: qty 2

## 2016-03-03 MED ORDER — CYCLOBENZAPRINE HCL 10 MG PO TABS
10.0000 mg | ORAL_TABLET | Freq: Once | ORAL | Status: AC
  Administered 2016-03-03: 10 mg via ORAL
  Filled 2016-03-03: qty 1

## 2016-03-03 MED ORDER — CYCLOBENZAPRINE HCL 10 MG PO TABS: 10.0000 mg | ORAL_TABLET | Freq: Three times a day (TID) | ORAL | 0 refills | Status: AC | PRN

## 2016-03-03 NOTE — ED Provider Notes (Signed)
[facility]-EMER[facility]Y DEPT Provider Note   CSN: [id number] Arrival date & time: 03/03/16  0439     History   Chief Complaint Chief Complaint  Patient presents with  . Chest Pain    HPI Larry Keith is a 21 y.o. male.  HPI   21 yo M with h/o chronic CP here with left-sided CP. Pt states his sx started 2 days ago as acute onset of sharp, positional, but also pleuritic left-sided chest pain. 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 Denies direct trauma. He endorses associated mild SOB, no diaphoresis or nausea. No vomiting. No fevers. No cough. He does not smoke. No family h/o cardiac disease. Denies any alleviating factors other than rest/not moving. Pain worse with movement, palpation.  Past Medical History:  Diagnosis Date  . Asthma    seasonal- no current treatment  . Back pain last 1-2 years   lower back-evaluation by PCP and physical therapy @ [facility]  . Chest pain march 2014   pain and lump at rt breast area  . Heart murmur   . Hypoglycemia   . 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 Gynecomastia, male 04/03/2012    Past Surgical History:  Procedure Laterality Date  . GYNECOMASTIA MASTECTOMY Right 04/12/2012   Procedure: MASTECTOMY GYNECOMASTIA;  Surgeon: Liz Malady, MD;  Location: [facility];  Service: General;  Laterality: Right;  Excision of right gynecomastia       Home Medications    Prior to Admission medications   Medication Sig Start Date End Date Taking? Authorizing Provider  cyclobenzaprine (FLEXERIL) 10 MG tablet Take 1 tablet (10 mg total) by mouth 3 (three) times daily as needed for muscle spasms. 03/03/16   Shaune Pollack, MD  diclofenac (VOLTAREN) 75 MG EC tablet Take 1 tablet (75 mg total) by mouth 2 (two) times daily. Patient not taking: Reported on 02/15/2016 02/07/14   Ozella Rocks, MD  ibuprofen  (ADVIL,MOTRIN) 800 MG tablet Take 1 tablet (800 mg total) by mouth 3 (three) times daily. 02/15/16   Roxy Horseman, PA-C  naproxen (NAPROSYN) 500 MG tablet Take 1 tablet (500 mg total) by mouth 2 (two) times daily with a meal. 03/03/16 03/10/16  Shaune Pollack, MD  pseudoephedrine-acetaminophen (TYLENOL SINUS) 30-500 MG TABS tablet Take 1 tablet by mouth every 4 (four) hours as needed (sinus pain).    Historical Provider, MD  trimethoprim-polymyxin b (POLYTRIM) ophthalmic solution Place 2 drops into both eyes every 4 (four) hours. Patient not taking: Reported on 02/15/2016 12/27/14   Mary-Margaret Daphine Deutscher, FNP    Family History Family History  Problem Relation Age of Onset  . Arthritis Mother     Social History Social History  Substance Use Topics  . Smoking status: Never Smoker  . Smokeless tobacco: Never Used  . Alcohol use No     Allergies   Hydrocodone   Review of Systems Review of Systems  Constitutional: Negative for chills, fatigue and fever.  HENT: Negative for congestion and rhinorrhea.   Eyes: Negative for visual disturbance.  Respiratory: Positive for chest tightness and shortness of breath. Negative for cough and wheezing.   Cardiovascular: Positive for chest pain. Negative for leg swelling.  Gastrointestinal: Negative for abdominal pain, diarrhea, nausea and vomiting.  Genitourinary: Negative for dysuria and flank pain.  Musculoskeletal: Negative for neck pain and neck stiffness.  Skin:  Negative for rash and wound.  Allergic/Immunologic: Negative for immunocompromised state.  Neurological: Negative for syncope, weakness and headaches.  All other systems reviewed and are negative.    Physical Exam Updated Vital Signs BP 120/78   Pulse 62   Resp 19   SpO2 100%   Physical Exam  Constitutional: He is oriented to person, place, and time. He appears well-developed and well-nourished. No distress.  HENT:  Head: Normocephalic and atraumatic.  Eyes: Conjunctivae  are normal.  Neck: Neck supple.  Cardiovascular: Normal rate, regular rhythm and normal heart sounds.  Exam reveals no friction rub.   No murmur heard. Pulmonary/Chest: Effort normal and breath sounds normal. No respiratory distress. He has no wheezes. He has no rales. He exhibits tenderness (moderate left-sided chest wall TTP along lower parasternal intercostal spaces; no deformity or bruising).  Abdominal: Soft. He exhibits no distension.  Musculoskeletal: He exhibits no edema.  Neurological: He is alert and oriented to person, place, and time. He exhibits normal muscle tone.  Skin: Skin is warm. Capillary refill takes less than 2 seconds.  Psychiatric: He has a normal mood and affect.  Nursing note and vitals reviewed.    ED Treatments / Results  Labs (all labs ordered are listed, but only abnormal results are displayed) Labs Reviewed  BASIC METABOLIC PANEL  CBC  D-DIMER, QUANTITATIVE (NOT AT [facility])  I-STAT TROPOININ, ED  Rosezena Sensor, ED    EKG  EKG Interpretation  Date/Time:  Friday March 03 2016 04:50:32 EST Ventricular Rate:  60 PR Interval:  166 QRS Duration: 96 QT Interval:  390 QTC Calculation: 390 R Axis:   92 Text Interpretation:  Normal sinus rhythm Rightward axis Moderate voltage criteria for LVH, may be normal variant Early repolarization Borderline ECG No significant change since last tracing Confirmed by Dyesha Henault MD, Sheria Lang [phone]) on 03/03/2016 7:16:09 AM       Radiology Dg Chest 2 View  Result Date: 03/03/2016 CLINICAL DATA:  Chest pain for a few days, difficulty breathing. Vomiting. Shortness of breath. History of asthma. EXAM: CHEST  2 VIEW COMPARISON:  Chest radiograph October 14, 2016 FINDINGS: Cardiomediastinal silhouette is normal. No pleural effusions or focal consolidations. Trachea projects midline and there is no pneumothorax. Soft tissue planes and included osseous structures are non-suspicious. IMPRESSION: Normal chest. Electronically Signed    By: Awilda Metro M.D.   On: 03/03/2016 06:15    Procedures Procedures (including critical care time)  Medications Ordered in ED Medications  ketorolac (TORADOL) injection 60 mg (60 mg Intramuscular Given 03/03/16 0809)  cyclobenzaprine (FLEXERIL) tablet 10 mg (10 mg Oral Given 03/03/16 0809)     Initial Impression / Assessment and Plan / ED Course  I have reviewed the triage vital signs and the nursing notes.  Pertinent labs & imaging results that were available during my care of the patient were reviewed by me and considered in my medical decision making (see chart for details).    21 yo M with PMHx as above here with left-sided CP. Suspect costochondritis/intercostal spasm 2/2 frequent lifting at work. Pain reproducible on exam. CXR clear without PTX, PNA, or other abnormality. No skin changes. EKG is non-ischemic and delta trop neg with HEART score <3 - do not suspect ACS. D-Dimer neg, doubt PE. Will treat with NSAIDs, flexeril, and d/c home.   Final Clinical Impressions(s) / ED Diagnoses   Final diagnoses:  Costochondritis    New Prescriptions New Prescriptions   CYCLOBENZAPRINE (FLEXERIL) 10 MG TABLET    Take  1 tablet (10 mg total) by mouth 3 (three) times daily as needed for muscle spasms.   NAPROXEN (NAPROSYN) 500 MG TABLET    Take 1 tablet (500 mg total) by mouth 2 (two) times daily with a meal.     Shaune Pollackameron Robie Mcniel, MD 03/03/16 [phone]

## 2016-03-03 NOTE — ED Triage Notes (Signed)
Patient with chest pain that started a few days ago.  He states that he has pain in his left lung and is having a trouble taking a deep breath. He states that he has vomited a few times, but just in the morning.  He is feeling short of breath.

## 2016-09-21 DIAGNOSIS — M549 Dorsalgia, unspecified: Secondary | ICD-10-CM | POA: Diagnosis not present

## 2016-09-21 DIAGNOSIS — L609 Nail disorder, unspecified: Secondary | ICD-10-CM | POA: Diagnosis not present

## 2016-09-22 DIAGNOSIS — Z13220 Encounter for screening for lipoid disorders: Secondary | ICD-10-CM | POA: Diagnosis not present

## 2016-09-22 DIAGNOSIS — Z23 Encounter for immunization: Secondary | ICD-10-CM | POA: Diagnosis not present

## 2016-09-22 DIAGNOSIS — Z0000 Encounter for general adult medical examination without abnormal findings: Secondary | ICD-10-CM | POA: Diagnosis not present

## 2017-02-03 DIAGNOSIS — L219 Seborrheic dermatitis, unspecified: Secondary | ICD-10-CM | POA: Diagnosis not present

## 2017-02-03 DIAGNOSIS — B360 Pityriasis versicolor: Secondary | ICD-10-CM | POA: Diagnosis not present

## 2017-02-03 DIAGNOSIS — Z79899 Other long term (current) drug therapy: Secondary | ICD-10-CM | POA: Diagnosis not present

## 2017-02-05 MED FILL — KETOCONAZOLE 2% CREAM: 2 | 30 days supply | Qty: 120 | Fill #0

## 2017-02-05 MED FILL — FLUCONAZOLE 200 MG TABLET: 200 | 7 days supply | Qty: 7 | Fill #0

## 2017-02-05 MED FILL — KETOCONAZOLE 2% SHAMPOO: 2 | 30 days supply | Qty: 120 | Fill #0

## 2017-03-03 DIAGNOSIS — R599 Enlarged lymph nodes, unspecified: Secondary | ICD-10-CM | POA: Diagnosis not present

## 2017-03-03 DIAGNOSIS — B360 Pityriasis versicolor: Secondary | ICD-10-CM | POA: Diagnosis not present

## 2017-03-03 DIAGNOSIS — L219 Seborrheic dermatitis, unspecified: Secondary | ICD-10-CM | POA: Diagnosis not present

## 2017-06-15 MED FILL — KETOCONAZOLE 2% SHAMPOO: 2 | 30 days supply | Qty: 120 | Fill #1

## 2017-06-22 ENCOUNTER — Encounter (HOSPITAL_COMMUNITY): Payer: Self-pay

## 2017-06-22 ENCOUNTER — Other Ambulatory Visit: Payer: Self-pay

## 2017-06-22 ENCOUNTER — Emergency Department (HOSPITAL_COMMUNITY): Payer: 59

## 2017-06-22 ENCOUNTER — Emergency Department (HOSPITAL_COMMUNITY)
Admission: EM | Admit: 2017-06-22 | Discharge: 2017-06-22 | Disposition: A | Discharge status: Home / Self Care | Payer: 59 | Attending: Emergency Medicine | Admitting: Emergency Medicine

## 2017-06-22 DIAGNOSIS — Z79899 Other long term (current) drug therapy: Secondary | ICD-10-CM | POA: Insufficient documentation

## 2017-06-22 DIAGNOSIS — R1084 Generalized abdominal pain: Secondary | ICD-10-CM | POA: Insufficient documentation

## 2017-06-22 DIAGNOSIS — E162 Hypoglycemia, unspecified: Secondary | ICD-10-CM | POA: Diagnosis not present

## 2017-06-22 DIAGNOSIS — E860 Dehydration: Secondary | ICD-10-CM | POA: Insufficient documentation

## 2017-06-22 DIAGNOSIS — E161 Other hypoglycemia: Secondary | ICD-10-CM | POA: Diagnosis not present

## 2017-06-22 DIAGNOSIS — R110 Nausea: Secondary | ICD-10-CM | POA: Diagnosis not present

## 2017-06-22 DIAGNOSIS — R112 Nausea with vomiting, unspecified: Secondary | ICD-10-CM | POA: Diagnosis present

## 2017-06-22 DIAGNOSIS — I959 Hypotension, unspecified: Secondary | ICD-10-CM | POA: Diagnosis not present

## 2017-06-22 LAB — CBC WITH DIFFERENTIAL/PLATELET
BASOS PCT: 0 %
Basophils Absolute: 0 10*3/uL (ref 0.0–0.1)
EOS PCT: 0 %
Eosinophils Absolute: 0 10*3/uL (ref 0.0–0.7)
HCT: 45.2 % (ref 39.0–52.0)
Hemoglobin: 15.3 g/dL (ref 13.0–17.0)
LYMPHS ABS: 1.9 10*3/uL (ref 0.7–4.0)
Lymphocytes Relative: 9 %
MCH: 30 pg (ref 26.0–34.0)
MCHC: 33.8 g/dL (ref 30.0–36.0)
MCV: 88.6 fL (ref 78.0–100.0)
MONO ABS: 1.9 10*3/uL — AB (ref 0.1–1.0)
Monocytes Relative: 9 %
Neutro Abs: 16.9 10*3/uL — ABNORMAL HIGH (ref 1.7–7.7)
Neutrophils Relative %: 82 %
PLATELETS: 244 10*3/uL (ref 150–400)
RBC: 5.1 MIL/uL (ref 4.22–5.81)
RDW: 14.1 % (ref 11.5–15.5)
WBC: 20.7 10*3/uL — AB (ref 4.0–10.5)

## 2017-06-22 LAB — COMPREHENSIVE METABOLIC PANEL
ALT: 44 U/L (ref 17–63)
ANION GAP: 21 — AB (ref 5–15)
AST: 65 U/L — ABNORMAL HIGH (ref 15–41)
Albumin: 5 g/dL (ref 3.5–5.0)
Alkaline Phosphatase: 62 U/L (ref 38–126)
BUN: 17 mg/dL (ref 6–20)
CHLORIDE: 107 mmol/L (ref 101–111)
CO2: 16 mmol/L — ABNORMAL LOW (ref 22–32)
CREATININE: 1.29 mg/dL — AB (ref 0.61–1.24)
Calcium: 9.7 mg/dL (ref 8.9–10.3)
Glucose, Bld: 58 mg/dL — ABNORMAL LOW (ref 65–99)
POTASSIUM: 3.5 mmol/L (ref 3.5–5.1)
Sodium: 144 mmol/L (ref 135–145)
Total Bilirubin: 0.9 mg/dL (ref 0.3–1.2)
Total Protein: 8.1 g/dL (ref 6.5–8.1)

## 2017-06-22 LAB — LIPASE, BLOOD: LIPASE: 21 U/L (ref 11–51)

## 2017-06-22 LAB — ETHANOL: ALCOHOL ETHYL (B): 40 mg/dL — AB (ref <10)

## 2017-06-22 MED ORDER — SODIUM CHLORIDE 0.9 % IV SOLN
INTRAVENOUS | Status: DC
  Administered 2017-06-22: 17:00:00 via INTRAVENOUS

## 2017-06-22 MED ORDER — SUCRALFATE 1 G PO TABS: 1.0000 g | ORAL_TABLET | Freq: Four times a day (QID) | ORAL | 0 refills | Status: AC

## 2017-06-22 MED ORDER — IOPAMIDOL (ISOVUE-300) INJECTION 61%
100.0000 mL | Freq: Once | INTRAVENOUS | Status: AC | PRN
  Administered 2017-06-22: 100 mL via INTRAVENOUS

## 2017-06-22 MED ORDER — ONDANSETRON 8 MG PO TBDP
8.0000 mg | ORAL_TABLET | Freq: Three times a day (TID) | ORAL | 0 refills | Status: AC | PRN
Start: 2017-06-22 — End: ?

## 2017-06-22 MED ORDER — IOPAMIDOL (ISOVUE-300) INJECTION 61%
INTRAVENOUS | Status: AC
  Filled 2017-06-22: qty 100

## 2017-06-22 MED ORDER — SODIUM CHLORIDE 0.9 % IV BOLUS
2000.0000 mL | Freq: Once | INTRAVENOUS | Status: AC
  Administered 2017-06-22: 2000 mL via INTRAVENOUS

## 2017-06-22 MED ORDER — METOCLOPRAMIDE HCL 5 MG/ML IJ SOLN
5.0000 mg | Freq: Once | INTRAMUSCULAR | Status: AC
  Administered 2017-06-22: 5 mg via INTRAVENOUS
  Filled 2017-06-22: qty 2

## 2017-06-22 MED ORDER — DIPHENHYDRAMINE HCL 50 MG/ML IJ SOLN
12.5000 mg | Freq: Once | INTRAMUSCULAR | Status: AC
  Administered 2017-06-22: 12.5 mg via INTRAVENOUS
  Filled 2017-06-22: qty 1

## 2017-06-22 MED ORDER — LORAZEPAM 2 MG/ML IJ SOLN
0.5000 mg | Freq: Once | INTRAMUSCULAR | Status: AC
  Administered 2017-06-22: 0.5 mg via INTRAVENOUS
  Filled 2017-06-22: qty 1

## 2017-06-22 MED ORDER — FAMOTIDINE 20 MG PO TABS: 20.0000 mg | ORAL_TABLET | Freq: Two times a day (BID) | ORAL | 0 refills | Status: AC

## 2017-06-22 NOTE — ED Triage Notes (Signed)
Pt c/o nausea, dry heaves, chills and shivering since 1430 when he woke up. Pt admits to ETOH last night.

## 2017-06-22 NOTE — ED Notes (Signed)
Bed: WA02 Expected date:  Expected time:  Means of arrival:  Comments: [age] yo N/V-ETOH last pm

## 2017-06-22 NOTE — ED Provider Notes (Signed)
[facility]-EMERGENCY DEPT Provider Note   CSN: [id number] Arrival date & time: 06/22/17  1631     History   Chief Complaint Chief Complaint  Patient presents with  . Nausea  . Emesis    HPI Larry Keith is a 22 y.o. male.  22 year old male presents with emesis x24 hours..  Patient admits to drinking copious amounts of alcohol yesterday.  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  Has not had any fever but has had some subjective chills.  His emesis has been nonbilious or bloody.  Denies any urinary symptoms.  Diffuse abdominal cramping.  Called EMS and was transported here     Past Medical History:  Diagnosis Date  . Asthma    seasonal- no current treatment  . Back pain last 1-2 years   lower back-evaluation by PCP and physical therapy @ [facility]  . Chest pain march 2014   pain and lump at rt breast area  . Heart murmur   . Hypoglycemia   . 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 Gynecomastia, male 04/03/2012    Past Surgical History:  Procedure Laterality Date  . GYNECOMASTIA MASTECTOMY Right 04/12/2012   Procedure: MASTECTOMY GYNECOMASTIA;  Surgeon: Liz MaladyBurke E Thompson, MD;  Location: [facility];  Service: General;  Laterality: Right;  Excision of right gynecomastia        Home Medications    Prior to Admission medications   Medication Sig Start Date End Date Taking? Authorizing Provider  cetirizine (ZYRTEC) 10 MG tablet Take 10 mg by mouth daily as needed for allergies.   Yes [provider]  cyclobenzaprine (FLEXERIL) 10 MG tablet Take 1 tablet (10 mg total) by mouth 3 (three) times daily as needed for muscle spasms. Patient not taking: Reported on 06/22/2017 03/03/16   Shaune PollackIsaacs, Cameron, MD  diclofenac (VOLTAREN) 75 MG EC tablet Take 1 tablet (75 mg total) by mouth 2 (two) times daily. Patient not taking: Reported on  02/15/2016 02/07/14   Ozella RocksMerrell, David J, MD  ibuprofen (ADVIL,MOTRIN) 800 MG tablet Take 1 tablet (800 mg total) by mouth 3 (three) times daily. Patient not taking: Reported on 06/22/2017 02/15/16   Roxy HorsemanBrowning, Robert, PA-C  ketoconazole (NIZORAL) 2 % shampoo AP[facility] & [facility] THREE TIMES WEEKLY 06/15/17   [provider]  pseudoephedrine-acetaminophen (TYLENOL SINUS) 30-500 MG TABS tablet Take 1 tablet by mouth every 4 (four) hours as needed (sinus pain).    [provider]  trimethoprim-polymyxin b (POLYTRIM) ophthalmic solution Place 2 drops into both eyes every 4 (four) hours. Patient not taking: Reported on 02/15/2016 12/27/14   Bennie PieriniMartin, Mary-Margaret, FNP    Family History Family History  Problem Relation Age of Onset  . Arthritis Mother     Social History Social History   Tobacco Use  . Smoking status: Never Smoker  . Smokeless tobacco: Never Used  Substance Use Topics  . Alcohol use: Yes  . Drug use: Yes    Types: Marijuana     Allergies   Hydrocodone   Review of Systems Review of Systems  All other systems reviewed and are negative.    Physical Exam Updated Vital Signs BP 118/60 (BP Location: Left Arm)   Pulse 78   Temp 97.9 F (36.6 C) (Oral)   Resp 16   Ht 1.88 m (6\' 2" )   Wt 77.1 kg (170 lb)  SpO2 100%   BMI 21.83 kg/m   Physical Exam  Constitutional: He is oriented to person, place, and time. He appears well-developed and well-nourished.  Non-toxic appearance. No distress.  HENT:  Head: Normocephalic and atraumatic.  Eyes: Pupils are equal, round, and reactive to light. Conjunctivae, EOM and lids are normal.  Neck: Normal range of motion. Neck supple. No tracheal deviation present. No thyroid mass present.  Cardiovascular: Normal rate, regular rhythm and normal heart sounds. Exam reveals no gallop.  No murmur heard. Pulmonary/Chest: Effort normal and breath sounds normal. No stridor. No respiratory distress. He has no decreased breath  sounds. He has no wheezes. He has no rhonchi. He has no rales.  Abdominal: Soft. Normal appearance and bowel sounds are normal. He exhibits no distension. There is no tenderness. There is no rigidity, no rebound, no guarding and no CVA tenderness.  Musculoskeletal: Normal range of motion. He exhibits no edema or tenderness.  Neurological: He is alert and oriented to person, place, and time. He has normal strength. No cranial nerve deficit or sensory deficit. GCS eye subscore is 4. GCS verbal subscore is 5. GCS motor subscore is 6.  Skin: Skin is warm and dry. No abrasion and no rash noted.  Psychiatric: He has a normal mood and affect. His speech is normal and behavior is normal.  Nursing note and vitals reviewed.    ED Treatments / Results  Labs (all labs ordered are listed, but only abnormal results are displayed) Labs Reviewed  CBC WITH DIFFERENTIAL/PLATELET  COMPREHENSIVE METABOLIC PANEL  LIPASE, BLOOD  ETHANOL    EKG None  Radiology No results found.  Procedures Procedures (including critical care time)  Medications Ordered in ED Medications  0.9 %  sodium chloride infusion (has no administration in time range)  sodium chloride 0.9 % bolus 2,000 mL (has no administration in time range)  metoCLOPramide (REGLAN) injection 5 mg (has no administration in time range)  diphenhydrAMINE (BENADRYL) injection 12.5 mg (has no administration in time range)  LORazepam (ATIVAN) injection 0.5 mg (has no administration in time range)     Initial Impression / Assessment and Plan / ED Course  I have reviewed the triage vital signs and the nursing notes.  Pertinent labs & imaging results that were available during my care of the patient were reviewed by me and considered in my medical decision making (see chart for details).     Patient with evidence of dehydration here and was treated with 2 L of saline as well as given Reglan and Benadryl Ativan and feels much better at this time.   Does have mild leukocytosis on the CBC and had abdominal CT which did not show any acute findings.  Suspect alcoholic gastritis versus cannabis hyperemesis.  Patient instructed to refrain from cannabis use and is stable for discharge  Final Clinical Impressions(s) / ED Diagnoses   Final diagnoses:  None    ED Discharge Orders    None       Lorre Nick, MD 06/22/17 2101

## 2017-09-10 MED FILL — KETOCONAZOLE 2% SHAMPOO: 2 | 60 days supply | Qty: 240 | Fill #2

## 2017-12-23 ENCOUNTER — Ambulatory Visit (INDEPENDENT_AMBULATORY_CARE_PROVIDER_SITE_OTHER): Payer: Self-pay | Admitting: Nurse Practitioner

## 2017-12-23 ENCOUNTER — Encounter: Payer: Self-pay | Admitting: Nurse Practitioner

## 2017-12-23 VITALS — BP 120/62 | HR 82 | Temp 98.8°F | Wt 180.0 lb

## 2017-12-23 DIAGNOSIS — R062 Wheezing: Secondary | ICD-10-CM

## 2017-12-23 DIAGNOSIS — R509 Fever, unspecified: Secondary | ICD-10-CM

## 2017-12-23 DIAGNOSIS — J101 Influenza due to other identified influenza virus with other respiratory manifestations: Secondary | ICD-10-CM

## 2017-12-23 LAB — POCT INFLUENZA A/B
INFLUENZA B, POC: POSITIVE — AB
Influenza A, POC: NEGATIVE

## 2017-12-23 MED ORDER — ALBUTEROL SULFATE HFA 108 (90 BASE) MCG/ACT IN AERS: 2.0000 | INHALATION_SPRAY | Freq: Four times a day (QID) | RESPIRATORY_TRACT | 0 refills | Status: AC | PRN

## 2017-12-23 NOTE — Patient Instructions (Signed)
Influenza, Adult -Ibuprofen or Tylenol for pain, fever, or general discomfort. May take Ibuprofen 800mg  every 8 hours for the next 3 days.  May stop sooner if symptoms improve. -Get plenty of rest. -Remain isolated until fever-free for 24 hours. -Increase fluids. -BRAT diet until appetite improves, bananas, rice, applesauce and toast. -Sleep elevated on at least 2 pillows at bedtime to help with cough. -Use a humidifier or vaporizer when at home and during sleep to help with cough. -Stop smoking while symptoms persist. -May use a teaspoon of honey or over-the-counter cough drops to help with cough. -Follow-up if symptoms do not improve.   Influenza, more commonly known as "the flu," is a viral infection that mainly affects the respiratory tract. The respiratory tract includes organs that help you breathe, such as the lungs, nose, and throat. The flu causes many symptoms similar to the common cold along with high fever and body aches. The flu spreads easily from person to person (is contagious). Getting a flu shot (influenza vaccination) every year is the best way to prevent the flu. What are the causes? This condition is caused by the influenza virus. You can get the virus by:  Breathing in droplets that are in the air from an infected person's cough or sneeze.  Touching something that has been exposed to the virus (has been contaminated) and then touching your mouth, nose, or eyes. What increases the risk? The following factors may make you more likely to get the flu:  Not washing or sanitizing your hands often.  Having close contact with many people during cold and flu season.  Touching your mouth, eyes, or nose without first washing or sanitizing your hands.  Not getting a yearly (annual) flu shot. You may have a higher risk for the flu, including serious problems such as a lung infection (pneumonia), if you:  Are older than 65.  Are pregnant.  Have a weakened disease-fighting  system (immune system). You may have a weakened immune system if you: ? Have HIV or AIDS. ? Are undergoing chemotherapy. ? Are taking medicines that reduce (suppress) the activity of your immune system.  Have a long-term (chronic) illness, such as heart disease, kidney disease, diabetes, or lung disease.  Have a liver disorder.  Are severely overweight (morbidly obese).  Have anemia. This is a condition that affects your red blood cells.  Have asthma. What are the signs or symptoms? Symptoms of this condition usually begin suddenly and last 4-14 days. They may include:  Fever and chills.  Headaches, body aches, or muscle aches.  Sore throat.  Cough.  Runny or stuffy (congested) nose.  Chest discomfort.  Poor appetite.  Weakness or fatigue.  Dizziness.  Nausea or vomiting. How is this diagnosed? This condition may be diagnosed based on:  Your symptoms and medical history.  A physical exam.  Swabbing your nose or throat and testing the fluid for the influenza virus. How is this treated? If the flu is diagnosed early, you can be treated with medicine that can help reduce how severe the illness is and how long it lasts (antiviral medicine). This may be given by mouth (orally) or through an IV. Taking care of yourself at home can help relieve symptoms. Your health care provider may recommend:  Taking over-the-counter medicines.  Drinking plenty of fluids. In many cases, the flu goes away on its own. If you have severe symptoms or complications, you may be treated in a hospital. Follow these instructions at home: Activity  Rest as needed and get plenty of sleep.  Stay home from work or school as told by your health care provider. Unless you are visiting your health care provider, avoid leaving home until your fever has been gone for 24 hours without taking medicine. Eating and drinking  Take an oral rehydration solution (ORS). This is a drink that is sold at  pharmacies and retail stores.  Drink enough fluid to keep your urine pale yellow.  Drink clear fluids in small amounts as you are able. Clear fluids include water, ice chips, diluted fruit juice, and low-calorie sports drinks.  Eat bland, easy-to-digest foods in small amounts as you are able. These foods include bananas, applesauce, rice, lean meats, toast, and crackers.  Avoid drinking fluids that contain a lot of sugar or caffeine, such as energy drinks, regular sports drinks, and soda.  Avoid alcohol.  Avoid spicy or fatty foods. General instructions      Take over-the-counter and prescription medicines only as told by your health care provider.  Use a cool mist humidifier to add humidity to the air in your home. This can make it easier to breathe.  Cover your mouth and nose when you cough or sneeze.  Wash your hands with soap and water often, especially after you cough or sneeze. If soap and water are not available, use alcohol-based hand sanitizer.  Keep all follow-up visits as told by your health care provider. This is important. How is this prevented?   Get an annual flu shot. You may get the flu shot in late summer, fall, or winter. Ask your health care provider when you should get your flu shot.  Avoid contact with people who are sick during cold and flu season. This is generally fall and winter. Contact a health care provider if:  You develop new symptoms.  You have: ? Chest pain. ? Diarrhea. ? A fever.  Your cough gets worse.  You produce more mucus.  You feel nauseous or you vomit. Get help right away if:  You develop shortness of breath or difficulty breathing.  Your skin or nails turn a bluish color.  You have severe pain or stiffness in your neck.  You develop a sudden headache or sudden pain in your face or ear.  You cannot eat or drink without vomiting. Summary  Influenza, more commonly known as "the flu," is a viral infection that  primarily affects your respiratory tract.  Symptoms of the flu usually begin suddenly and last 4-14 days.  Getting an annual flu shot is the best way to prevent getting the flu.  Stay home from work or school as told by your health care provider. Unless you are visiting your health care provider, avoid leaving home until your fever has been gone for 24 hours without taking medicine.  Keep all follow-up visits as told by your health care provider. This is important. This information is not intended to replace advice given to you by your health care provider. Make sure you discuss any questions you have with your health care provider. Document Released: 12/17/1999 Document Revised: 06/06/2017 Document Reviewed: 06/06/2017 Elsevier Interactive Patient Education  2019 [employer]

## 2017-12-23 NOTE — Progress Notes (Signed)
Subjective:     Larry Keith is a 22 y.o. male who presents for evaluation of influenza like symptoms. Symptoms include fevers up to 100.8 degrees, chills, hot and cold spells and sweats, headache, myalgias and malaise, dizziness and lightheadedness nasal discharge and which have been present for 4 days. He has tried to alleviate the symptoms with OTC Alka Seltzer Cold and Flu, Tussin DM with minimal relief. High risk factors for influenza complications: patient has a history of asthma.  The patient informs he has not used an albuterol inhaler for "20" years.  The patient did not get the flu shot this year.  Patient informs that he does smoke marijuana and last smoked about 3 days ago (however able to smell marijuana on his person).  The following portions of the patient's history were reviewed and updated as appropriate: allergies, current medications and past medical history.  Review of Systems Constitutional: positive for anorexia, chills, fatigue, fevers, malaise, sweats and weight loss, negative for night sweats Eyes: negative Ears, nose, mouth, throat, and face: positive for nasal congestion and sore throat, negative for ear drainage, earaches and hoarseness Respiratory: positive for asthma, cough and wheezing, negative for chronic bronchitis, dyspnea on exertion, emphysema and pleurisy/chest pain Cardiovascular: negative Gastrointestinal: positive for decreased appetite, negative for abdominal pain, change in bowel habits, constipation, diarrhea, nausea and vomiting Neurological: positive for dizziness and headaches, negative for coordination problems, gait problems, paresthesia and weakness     Objective:    BP 120/62 (BP Location: Left Arm, Patient Position: Sitting)   Pulse 82   Temp 98.8 F (37.1 C) (Oral)   Wt 180 lb (81.6 kg)   SpO2 98%   BMI 23.11 kg/m  General appearance: alert, cooperative, fatigued, no distress and appears uncomfortable Head: Normocephalic, without  obvious abnormality, atraumatic Eyes: conjunctivae/corneas clear. PERRL, EOM's intact. Fundi benign. Ears: normal TM's and external ear canals both ears Nose: Nares normal. Septum midline. Mucosa normal. No drainage or sinus tenderness. Throat: lips, mucosa, and tongue normal; teeth and gums normal Lungs: wheezes anterior - LUL and posterior - LLL, inspiratory Heart: regular rate and rhythm, S1, S2 normal, no murmur, click, rub or gallop Pulses: 2+ and symmetric Skin: Skin color, texture, turgor normal. No rashes or lesions Lymph nodes: cervical and submandibular nodes normal Neurologic: Grossly normal    Assessment:    Influenza B   Plan:   Exam findings, diagnosis etiology and medication use and indications reviewed with patient. Follow- Up and discharge instructions provided. No emergent/urgent issues found on exam.  This on the patient's clinical presentation, physical assessment, and positive influenza B test, patient symptoms are of viral etiology.  Patient has passed the window for treatment of Tamiflu, so stressed to the patient the importance of symptomatic treatment.  Also discussed with patient the importance of not smoking while his symptoms persist.  Patient should remain isolated or away from others until fever free for 24 hours.  Patient education was provided. Patient verbalized understanding of information provided and agrees with plan of care (POC), all questions answered. The patient is advised to call or return to clinic if condition does not see an improvement in symptoms, or to seek the care of the closest emergency department if condition worsens with the above plan.   1. Fever, unspecified fever cause  - POCT Influenza A/B- positive for Influenza B  2. Influenza B  -Ibuprofen or Tylenol for pain, fever, or general discomfort. May take Ibuprofen 800mg  every 8 hours for the  next 3 days.  May stop sooner if symptoms improve. -Get plenty of rest. -Remain isolated until  fever-free for 24 hours. -Increase fluids. -BRAT diet until appetite improves, bananas, rice, applesauce and toast. -Sleep elevated on at least 2 pillows at bedtime to help with cough. -Use a humidifier or vaporizer when at home and during sleep to help with cough. -Stop smoking while symptoms persist. -May use a teaspoon of honey or over-the-counter cough drops to help with cough. -Follow-up if symptoms do not improve.  3. Wheezing  - albuterol (PROVENTIL HFA;VENTOLIN HFA) 108 (90 Base) MCG/ACT inhaler; Inhale 2 puffs into the lungs every 6 (six) hours as needed for up to 10 days for wheezing or shortness of breath.  Dispense: 1 Inhaler; Refill: 0

## 2018-11-01 ENCOUNTER — Telehealth: Payer: 59 | Admitting: Physician Assistant

## 2018-11-01 DIAGNOSIS — G43819 Other migraine, intractable, without status migrainosus: Secondary | ICD-10-CM

## 2018-11-01 NOTE — Progress Notes (Signed)
Erroneous encounter. Disregard.

## 2018-11-01 NOTE — Progress Notes (Signed)
Hi Larry Keith,  I'm sorry you are not feeling well.   As discussed on the phone, a first ever migraine is something that should be taken care of with your PCP.  Please see below for information.    Based on what you shared with me, I feel your condition warrants further evaluation and I recommend that you be seen for a face to face visit.  Please contact your primary care physician practice to be seen. Many offices offer virtual options to be seen via video if you are not comfortable going in person to a medical facility at this time.  If you do not have a PCP,  offers a free physician referral service available at [phone]. Our trained staff has the experience, knowledge and resources to put you in touch with a physician who is right for you.   You also have the option of a video visit through [email or web address]  If you are having a true medical emergency please call 911.  NOTE: If you entered your credit card information for this eVisit, you will not be charged. You may see a "hold" on your card for the $35 but that hold will drop off and you will not have a charge processed.  Your e-visit answers were reviewed by a board certified advanced clinical practitioner to complete your personal care plan.  Thank you for using e-Visits.

## 2019-02-28 ENCOUNTER — Ambulatory Visit: Payer: 59 | Attending: Internal Medicine

## 2019-02-28 DIAGNOSIS — Z20822 Contact with and (suspected) exposure to covid-19: Secondary | ICD-10-CM | POA: Diagnosis not present

## 2019-03-01 LAB — NOVEL CORONAVIRUS, NAA: SARS-CoV-2, NAA: NOT DETECTED

## 2020-01-17 IMAGING — CT CT ABD-PELV W/ CM
2 of 4 series · 16 of 46 positions shown, 18 images · IV contrast (iopamidol)
Comparison: None.

CLINICAL DATA: Nausea, dry heaves is and chills and should bring
since this afternoon.

EXAM:
CT ABDOMEN AND PELVIS WITH CONTRAST
TECHNIQUE: Multidetector CT imaging of the abdomen and pelvis was performed
using the standard protocol following bolus administration of
intravenous contrast.
CONTRAST:  100mL FSZ0UC-EKK IOPAMIDOL (FSZ0UC-EKK) INJECTION 61%

[Series 2: axial st · axial · 0.88mm/px · z∈[+1238,+1668]mm · 13 of 98 slices shown, 15 images]
[im 6/98  soft-tissue]
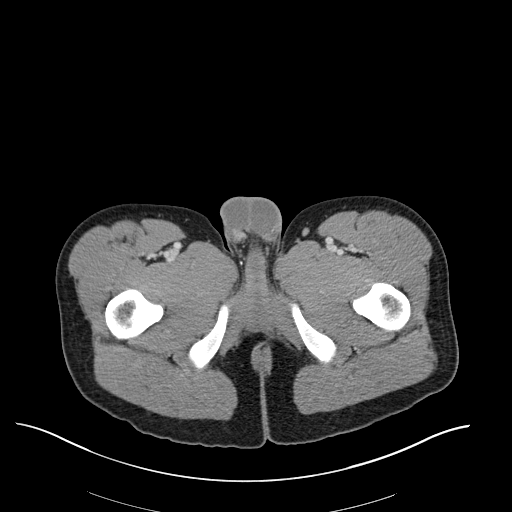
[im 6/98  bone]
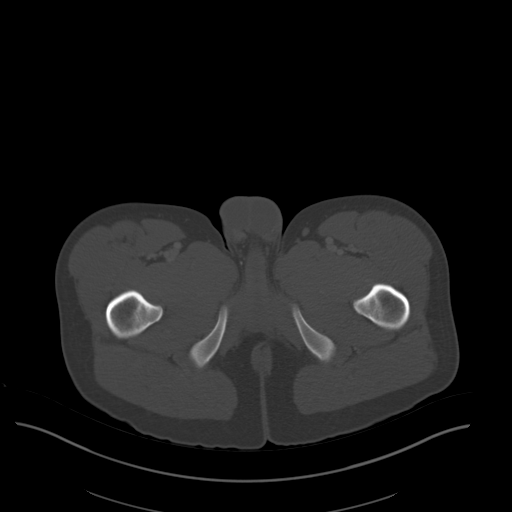
[im 12/98  soft-tissue]
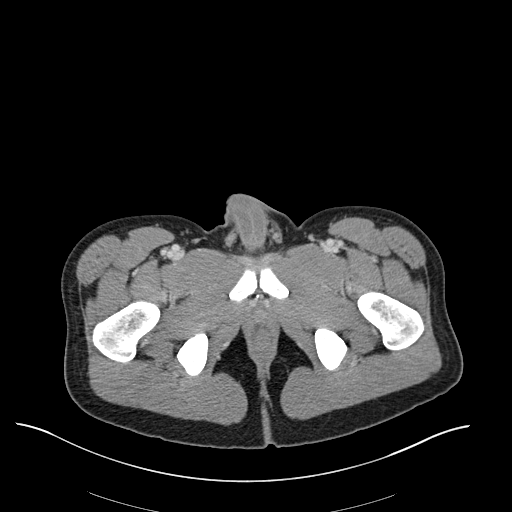
[im 23/98  soft-tissue]
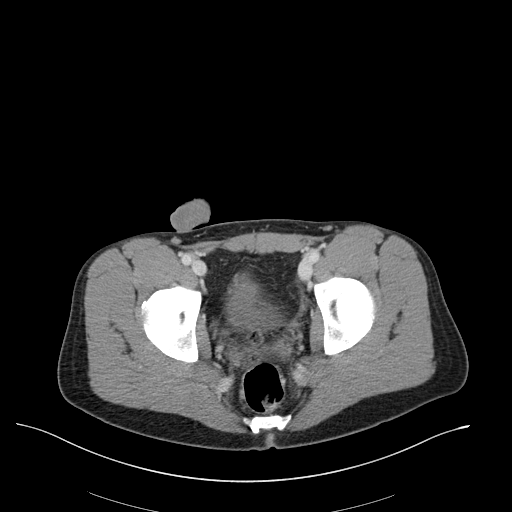
[im 29/98  soft-tissue]
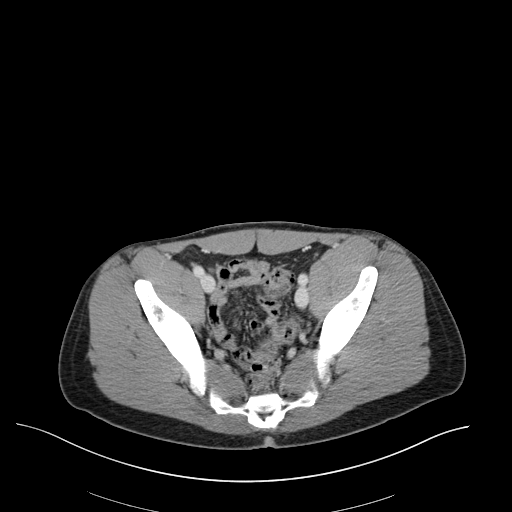
[im 35/98  soft-tissue]
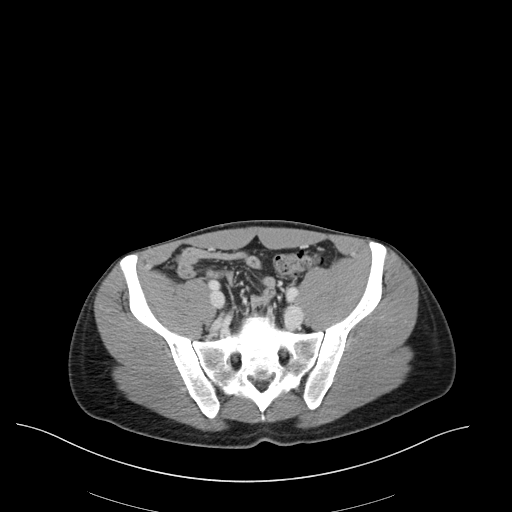
[im 40/98  soft-tissue]
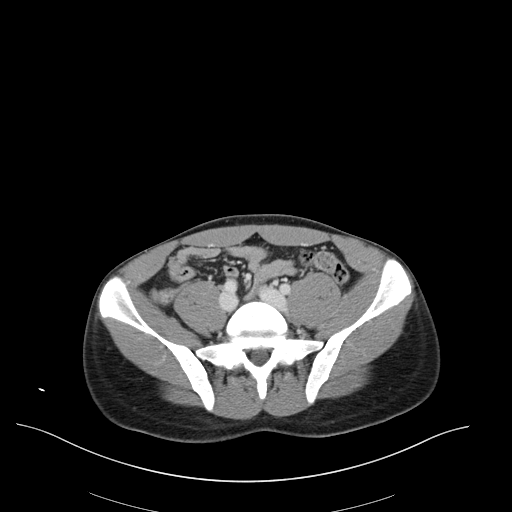
[im 52/98  soft-tissue]
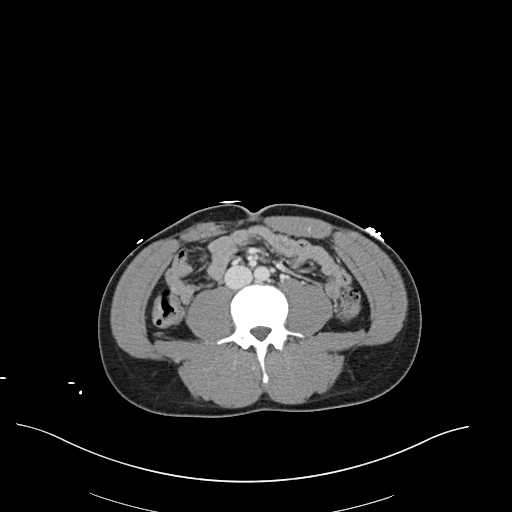
[im 58/98  soft-tissue]
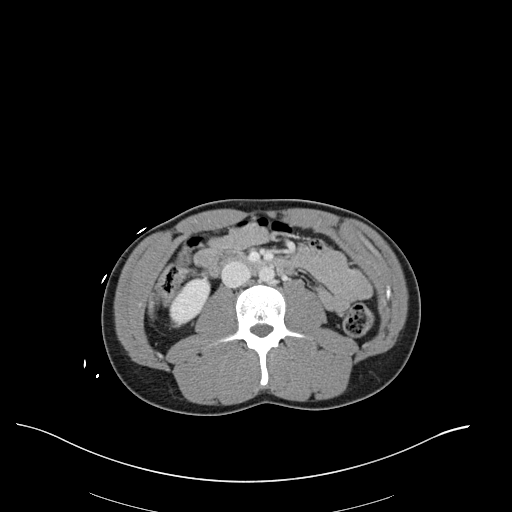
[im 63/98  soft-tissue]
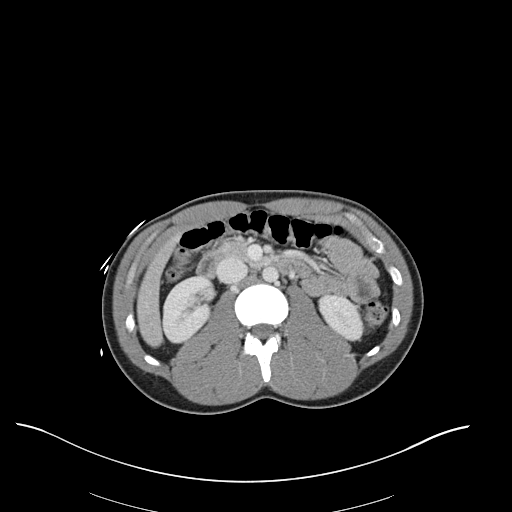
[im 63/98  bone]
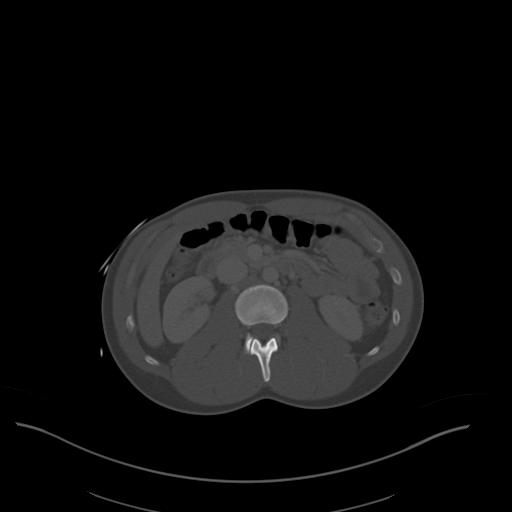
[im 69/98  soft-tissue]
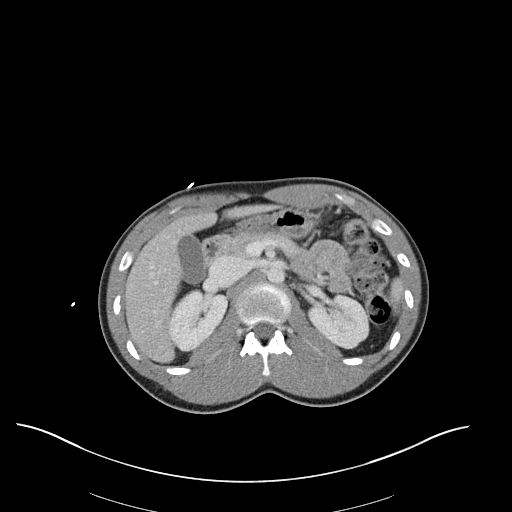
[im 75/98  soft-tissue]
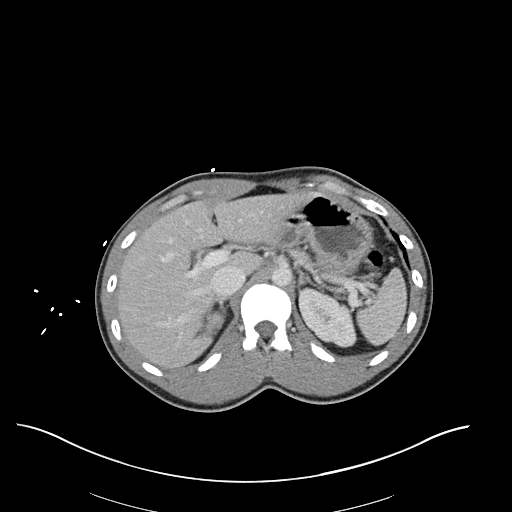
[im 86/98  soft-tissue]
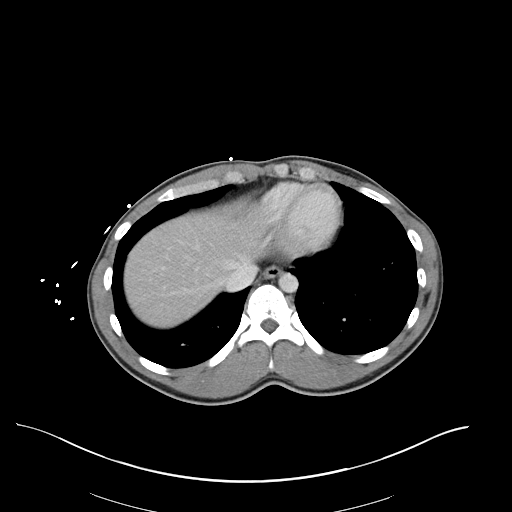
[im 92/98  soft-tissue]
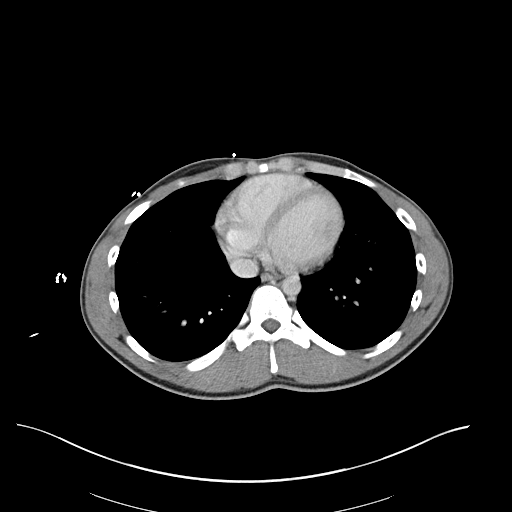

[Series 5: coronal st · coronal · 0.67mm/px · 3 of 91 slices shown]
[im 31/91  soft-tissue]
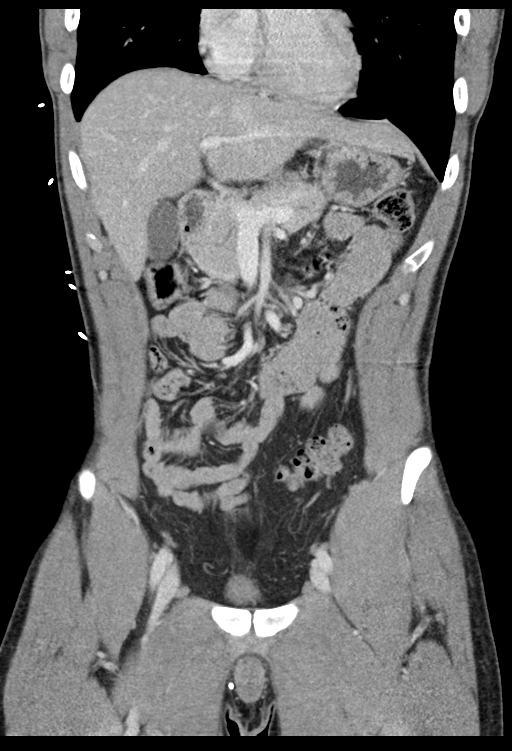
[im 41/91  soft-tissue]
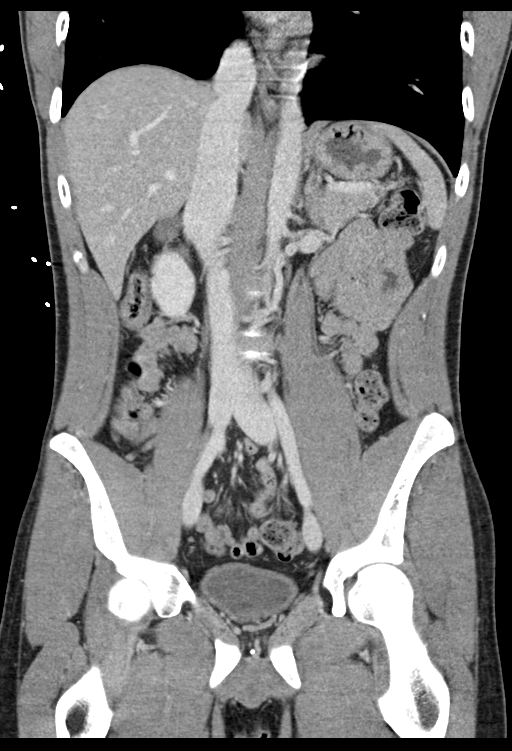
[im 51/91  soft-tissue]
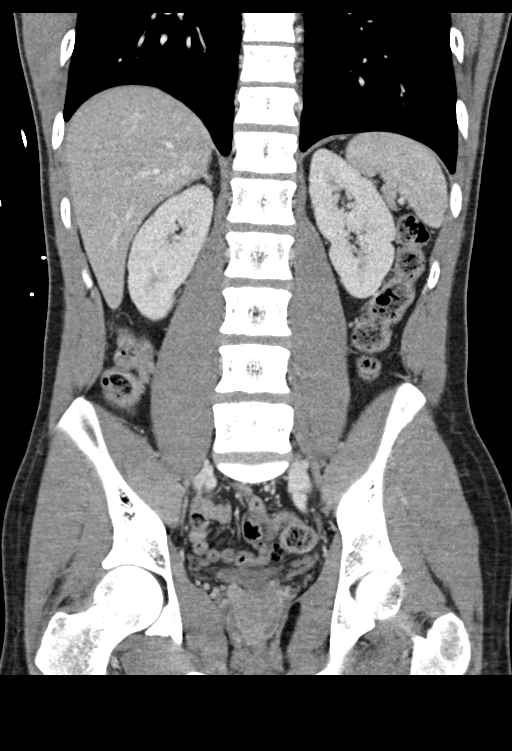

[16 of 46 positions shown; findings below may reference images not displayed]

FINDINGS: Lower chest: Clear lung bases.  Heart normal in size.

Hepatobiliary: No focal liver abnormality is seen. No gallstones,
gallbladder wall thickening, or biliary dilatation.

Pancreas: Unremarkable. No pancreatic ductal dilatation or
surrounding inflammatory changes.

Spleen: Normal in size without focal abnormality.

Adrenals/Urinary Tract: Adrenal glands are unremarkable. Kidneys are
normal, without renal calculi, focal lesion, or hydronephrosis.
Bladder is unremarkable.

Stomach/Bowel: Stomach is within normal limits. Appendix appears
normal. No evidence of bowel wall thickening, distention, or
inflammatory changes.

Vascular/Lymphatic: No significant vascular findings are present. No
enlarged abdominal or pelvic lymph nodes.

Reproductive: Unremarkable.

Other: No abdominal wall hernia or abnormality. No abdominopelvic
ascites.

Musculoskeletal: No acute or significant osseous findings.
IMPRESSION: 1. Normal CT scan of the abdomen pelvis with contrast.

## 2020-07-14 ENCOUNTER — Other Ambulatory Visit: Payer: Self-pay

## 2020-07-14 DIAGNOSIS — Z1152 Encounter for screening for COVID-19: Secondary | ICD-10-CM

## 2020-07-15 LAB — SARS-COV-2, NAA 2 DAY TAT

## 2020-07-15 LAB — NOVEL CORONAVIRUS, NAA: SARS-CoV-2, NAA: NOT DETECTED

## 2023-01-26 ENCOUNTER — Encounter: Payer: Self-pay | Admitting: Family Medicine

## 2023-01-26 ENCOUNTER — Telehealth (INDEPENDENT_AMBULATORY_CARE_PROVIDER_SITE_OTHER): Payer: Self-pay | Admitting: Family Medicine

## 2023-01-26 VITALS — BP 127/81 | HR 81 | Temp 97.7°F | Ht 74.0 in | Wt 185.0 lb

## 2023-01-26 DIAGNOSIS — U071 COVID-19: Secondary | ICD-10-CM | POA: Diagnosis not present

## 2023-01-26 DIAGNOSIS — Z7689 Persons encountering health services in other specified circumstances: Secondary | ICD-10-CM | POA: Diagnosis not present

## 2023-01-26 DIAGNOSIS — J4521 Mild intermittent asthma with (acute) exacerbation: Secondary | ICD-10-CM

## 2023-01-26 MED ORDER — PREDNISONE 10 MG PO TABS: 10.0000 mg | ORAL_TABLET | Freq: Two times a day (BID) | ORAL | 0 refills | Status: AC

## 2023-01-26 MED ORDER — AIRSUPRA 90-80 MCG/ACT IN AERO: 2.0000 | INHALATION_SPRAY | RESPIRATORY_TRACT | 3 refills | Status: AC | PRN

## 2023-01-26 NOTE — Patient Instructions (Signed)

## 2023-01-26 NOTE — Progress Notes (Addendum)
Virtual Visit via Video Note  Madelaine Bhat, CMA,acting as a scribe for Ellender Hose, NP.,have documented all relevant documentation on the behalf of Ellender Hose, NP,as directed by  Ellender Hose, NP while in the presence of Ellender Hose, NP.  I connected with Larry Keith on 02/04/23 at 10:00 AM EST by a video enabled telemedicine application and verified that I am speaking with the correct person using two identifiers.  Patient Location: Home Provider Location: Office/Clinic  I discussed the limitations, risks, security, and privacy concerns of performing an evaluation and management service by video and the availability of in person appointments. I also discussed with the patient that there may be a patient responsible charge related to this service. The patient expressed understanding and agreed to proceed.  Subjective: PCP: Patient, No Pcp Per  Chief Complaint  Patient presents with   Establish Care   Covid Positive    Patient presents today to establish care. Patient tested positive for covid last Wednesday. He reports he has a runny nose, headache, chills, bodyaches. He reports his chest was hurting yesterday. He reports when he step outside on the porch his chest will start hurting. He has been taking dayquil and nyquil and ibuprofen.    Patient is a 28 year old male who presents for cold symptoms. He states on Wednesday 01/23/2023, he started having body aches, chest discomfort, slight cough, sinus pain. So he decided to test for COVID 19 using the home kit  and he tested positive. Today patient states that he is having shortness of breathe especially when he breathes in cold air.Patient states he has a past diagnosis of asthma.     ROS: Per HPI  Current Outpatient Medications:    Albuterol-Budesonide (AIRSUPRA) 90-80 MCG/ACT AERO, Inhale 2 puffs into the lungs every 4 (four) hours as needed., Disp: 10.7 g, Rfl: 3   albuterol (PROVENTIL HFA;VENTOLIN HFA) 108 (90 Base)  MCG/ACT inhaler, Inhale 2 puffs into the lungs every 6 (six) hours as needed for up to 10 days for wheezing or shortness of breath., Disp: 1 Inhaler, Rfl: 0   cetirizine (ZYRTEC) 10 MG tablet, Take 10 mg by mouth daily as needed for allergies., Disp: , Rfl:    cyclobenzaprine (FLEXERIL) 10 MG tablet, Take 1 tablet (10 mg total) by mouth 3 (three) times daily as needed for muscle spasms., Disp: 30 tablet, Rfl: 0   diclofenac (VOLTAREN) 75 MG EC tablet, Take 1 tablet (75 mg total) by mouth 2 (two) times daily. (Patient not taking: Reported on 12/23/2017), Disp: 60 tablet, Rfl: 0   famotidine (PEPCID) 20 MG tablet, Take 1 tablet (20 mg total) by mouth 2 (two) times daily., Disp: 30 tablet, Rfl: 0   ibuprofen (ADVIL,MOTRIN) 800 MG tablet, Take 1 tablet (800 mg total) by mouth 3 (three) times daily., Disp: 21 tablet, Rfl: 0   ketoconazole (NIZORAL) 2 % shampoo, APPLY & WASH SCALP THREE TIMES WEEKLY, Disp: , Rfl: 6   ondansetron (ZOFRAN ODT) 8 MG disintegrating tablet, Take 1 tablet (8 mg total) by mouth every 8 (eight) hours as needed for nausea or vomiting. (Patient not taking: Reported on 12/23/2017), Disp: 20 tablet, Rfl: 0   pseudoephedrine-acetaminophen (TYLENOL SINUS) 30-500 MG TABS tablet, Take 1 tablet by mouth every 4 (four) hours as needed (sinus pain)., Disp: , Rfl:    sucralfate (CARAFATE) 1 g tablet, Take 1 tablet (1 g total) by mouth 4 (four) times daily. (Patient not taking: Reported on 12/23/2017), Disp: 30 tablet,  Rfl: 0   trimethoprim-polymyxin b (POLYTRIM) ophthalmic solution, Place 2 drops into both eyes every 4 (four) hours. (Patient not taking: Reported on 12/23/2017), Disp: 10 mL, Rfl: 0  Observations/Objective:  Physical Exam Vitals (This is virtual visit) reviewed.  Neurological:     Mental Status: He is alert.     Assessment and Plan:  Start Prednisone 10 mg dose pack   Follow Up Instructions: Return if symptoms worsen or fail to improve, for may return to work on  01/29/2023.   I discussed the assessment and treatment plan with the patient. The patient was provided an opportunity to ask questions, and all were answered. The patient agreed with the plan and demonstrated an understanding of the instructions.   The patient was advised to call back or seek an in-person evaluation if the symptoms worsen or if the condition fails to improve as anticipated.  The above assessment and management plan was discussed with the patient. The patient verbalized understanding of and has agreed to the management plan.   I, Ellender Hose, NP, have reviewed all documentation for this visit. The documentation on 02/04/2023 for the exam, diagnosis, procedures, and orders are all accurate and complete.

## 2023-02-04 DIAGNOSIS — J4521 Mild intermittent asthma with (acute) exacerbation: Secondary | ICD-10-CM | POA: Insufficient documentation

## 2023-02-04 NOTE — Assessment & Plan Note (Signed)
Patient does not want Paxlovid.

## 2023-10-02 ENCOUNTER — Emergency Department (HOSPITAL_BASED_OUTPATIENT_CLINIC_OR_DEPARTMENT_OTHER)
Admission: EM | Admit: 2023-10-02 | Discharge: 2023-10-02 | Disposition: A | Discharge status: Home / Self Care | Attending: Emergency Medicine | Admitting: Emergency Medicine

## 2023-10-02 ENCOUNTER — Emergency Department (HOSPITAL_BASED_OUTPATIENT_CLINIC_OR_DEPARTMENT_OTHER): Admitting: Radiology

## 2023-10-02 ENCOUNTER — Other Ambulatory Visit: Payer: Self-pay

## 2023-10-02 DIAGNOSIS — J45909 Unspecified asthma, uncomplicated: Secondary | ICD-10-CM | POA: Diagnosis not present

## 2023-10-02 DIAGNOSIS — M25512 Pain in left shoulder: Secondary | ICD-10-CM | POA: Diagnosis not present

## 2023-10-02 DIAGNOSIS — Y92410 Unspecified street and highway as the place of occurrence of the external cause: Secondary | ICD-10-CM | POA: Insufficient documentation

## 2023-10-02 DIAGNOSIS — M542 Cervicalgia: Secondary | ICD-10-CM | POA: Diagnosis not present

## 2023-10-02 DIAGNOSIS — M25522 Pain in left elbow: Secondary | ICD-10-CM | POA: Insufficient documentation

## 2023-10-02 DIAGNOSIS — S39012A Strain of muscle, fascia and tendon of lower back, initial encounter: Secondary | ICD-10-CM | POA: Insufficient documentation

## 2023-10-02 DIAGNOSIS — S3992XA Unspecified injury of lower back, initial encounter: Secondary | ICD-10-CM | POA: Diagnosis present
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
## 2023-10-02 NOTE — ED Notes (Signed)
 Discharge instructions, follow up care, and pain management reviewed and explained, pt verbalized understanding and had no further questions on d/c. Pt caox4, ambulatory NAD on d/c.
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
